# Patient Record
Sex: Male | Born: 1955 | Race: White | Hispanic: No | Marital: Married | Smoking: Never smoker
Health system: Southern US, Community
[De-identification: ages and names within clinical notes are randomized; demographics above are authoritative.]

## PROBLEM LIST (undated history)

## (undated) DIAGNOSIS — I4891 Unspecified atrial fibrillation: Secondary | ICD-10-CM

## (undated) DIAGNOSIS — I10 Essential (primary) hypertension: Secondary | ICD-10-CM

## (undated) DIAGNOSIS — I499 Cardiac arrhythmia, unspecified: Secondary | ICD-10-CM

## (undated) HISTORY — PX: CHOLECYSTECTOMY: SHX55

---

## 2019-02-05 DIAGNOSIS — M8588 Other specified disorders of bone density and structure, other site: Secondary | ICD-10-CM | POA: Diagnosis not present

## 2019-02-05 DIAGNOSIS — M5416 Radiculopathy, lumbar region: Secondary | ICD-10-CM | POA: Diagnosis not present

## 2019-02-11 DIAGNOSIS — M5416 Radiculopathy, lumbar region: Secondary | ICD-10-CM | POA: Diagnosis not present

## 2019-02-26 DIAGNOSIS — M5416 Radiculopathy, lumbar region: Secondary | ICD-10-CM | POA: Diagnosis not present

## 2019-02-26 DIAGNOSIS — M5136 Other intervertebral disc degeneration, lumbar region: Secondary | ICD-10-CM | POA: Diagnosis not present

## 2019-02-27 DIAGNOSIS — M5416 Radiculopathy, lumbar region: Secondary | ICD-10-CM | POA: Diagnosis not present

## 2019-02-27 DIAGNOSIS — M5136 Other intervertebral disc degeneration, lumbar region: Secondary | ICD-10-CM | POA: Diagnosis not present

## 2019-03-05 ENCOUNTER — Other Ambulatory Visit (HOSPITAL_COMMUNITY): Payer: Self-pay | Admitting: Physical Medicine and Rehabilitation

## 2019-03-05 ENCOUNTER — Other Ambulatory Visit: Payer: Self-pay | Admitting: Physical Medicine and Rehabilitation

## 2019-03-05 DIAGNOSIS — M5416 Radiculopathy, lumbar region: Secondary | ICD-10-CM

## 2019-03-09 ENCOUNTER — Other Ambulatory Visit: Payer: Self-pay

## 2019-03-09 ENCOUNTER — Ambulatory Visit
Admission: RE | Admit: 2019-03-09 | Discharge: 2019-03-09 | Disposition: A | Discharge status: Home / Self Care | Payer: 59 | Source: Ambulatory Visit | Attending: Physical Medicine and Rehabilitation | Admitting: Physical Medicine and Rehabilitation

## 2019-03-09 DIAGNOSIS — M545 Low back pain: Secondary | ICD-10-CM | POA: Diagnosis not present

## 2019-03-09 DIAGNOSIS — M5416 Radiculopathy, lumbar region: Secondary | ICD-10-CM | POA: Insufficient documentation

## 2019-03-14 DIAGNOSIS — M5136 Other intervertebral disc degeneration, lumbar region: Secondary | ICD-10-CM | POA: Diagnosis not present

## 2019-03-14 DIAGNOSIS — M48062 Spinal stenosis, lumbar region with neurogenic claudication: Secondary | ICD-10-CM | POA: Diagnosis not present

## 2019-03-14 DIAGNOSIS — M5416 Radiculopathy, lumbar region: Secondary | ICD-10-CM | POA: Diagnosis not present

## 2019-04-01 DIAGNOSIS — M5136 Other intervertebral disc degeneration, lumbar region: Secondary | ICD-10-CM | POA: Diagnosis not present

## 2019-04-01 DIAGNOSIS — M5416 Radiculopathy, lumbar region: Secondary | ICD-10-CM | POA: Diagnosis not present

## 2019-05-17 DIAGNOSIS — M5416 Radiculopathy, lumbar region: Secondary | ICD-10-CM | POA: Diagnosis not present

## 2019-05-17 DIAGNOSIS — M5136 Other intervertebral disc degeneration, lumbar region: Secondary | ICD-10-CM | POA: Diagnosis not present

## 2019-05-17 DIAGNOSIS — M62830 Muscle spasm of back: Secondary | ICD-10-CM | POA: Diagnosis not present

## 2019-05-17 DIAGNOSIS — M48062 Spinal stenosis, lumbar region with neurogenic claudication: Secondary | ICD-10-CM | POA: Diagnosis not present

## 2019-06-13 DIAGNOSIS — M48062 Spinal stenosis, lumbar region with neurogenic claudication: Secondary | ICD-10-CM | POA: Diagnosis not present

## 2019-06-13 DIAGNOSIS — M5416 Radiculopathy, lumbar region: Secondary | ICD-10-CM | POA: Diagnosis not present

## 2019-06-13 DIAGNOSIS — M5136 Other intervertebral disc degeneration, lumbar region: Secondary | ICD-10-CM | POA: Diagnosis not present

## 2020-01-17 ENCOUNTER — Other Ambulatory Visit: Payer: Self-pay

## 2020-01-17 ENCOUNTER — Inpatient Hospital Stay
Admit: 2020-01-17 | Discharge: 2020-01-17 | Disposition: A | Discharge status: Home / Self Care | Payer: Managed Care, Other (non HMO) | Attending: Internal Medicine | Admitting: Internal Medicine

## 2020-01-17 ENCOUNTER — Inpatient Hospital Stay
Admission: EM | Admit: 2020-01-17 | Discharge: 2020-01-22 | DRG: 177 | Disposition: A | Discharge status: Home / Self Care | Payer: Managed Care, Other (non HMO) | Attending: Internal Medicine | Admitting: Internal Medicine

## 2020-01-17 ENCOUNTER — Emergency Department: Payer: Managed Care, Other (non HMO)

## 2020-01-17 ENCOUNTER — Encounter: Payer: Self-pay | Admitting: Emergency Medicine

## 2020-01-17 ENCOUNTER — Inpatient Hospital Stay: Payer: Managed Care, Other (non HMO)

## 2020-01-17 DIAGNOSIS — I10 Essential (primary) hypertension: Secondary | ICD-10-CM | POA: Diagnosis present

## 2020-01-17 DIAGNOSIS — Z888 Allergy status to other drugs, medicaments and biological substances status: Secondary | ICD-10-CM | POA: Diagnosis not present

## 2020-01-17 DIAGNOSIS — J1282 Pneumonia due to coronavirus disease 2019: Secondary | ICD-10-CM | POA: Diagnosis present

## 2020-01-17 DIAGNOSIS — U071 COVID-19: Principal | ICD-10-CM | POA: Diagnosis present

## 2020-01-17 DIAGNOSIS — I081 Rheumatic disorders of both mitral and tricuspid valves: Secondary | ICD-10-CM | POA: Diagnosis present

## 2020-01-17 DIAGNOSIS — J9601 Acute respiratory failure with hypoxia: Secondary | ICD-10-CM | POA: Diagnosis present

## 2020-01-17 DIAGNOSIS — Z79899 Other long term (current) drug therapy: Secondary | ICD-10-CM | POA: Diagnosis not present

## 2020-01-17 DIAGNOSIS — R000 Tachycardia, unspecified: Secondary | ICD-10-CM | POA: Diagnosis present

## 2020-01-17 DIAGNOSIS — Z91040 Latex allergy status: Secondary | ICD-10-CM

## 2020-01-17 DIAGNOSIS — Z9049 Acquired absence of other specified parts of digestive tract: Secondary | ICD-10-CM

## 2020-01-17 DIAGNOSIS — I4891 Unspecified atrial fibrillation: Secondary | ICD-10-CM | POA: Diagnosis present

## 2020-01-17 LAB — CBC
HCT: 45.6 % (ref 39.0–52.0)
Hemoglobin: 15.5 g/dL (ref 13.0–17.0)
MCH: 29.2 pg (ref 26.0–34.0)
MCHC: 34 g/dL (ref 30.0–36.0)
MCV: 86 fL (ref 80.0–100.0)
Platelets: 184 10*3/uL (ref 150–400)
RBC: 5.3 MIL/uL (ref 4.22–5.81)
RDW: 13.2 % (ref 11.5–15.5)
WBC: 9.6 10*3/uL (ref 4.0–10.5)
nRBC: 0 % (ref 0.0–0.2)

## 2020-01-17 LAB — BASIC METABOLIC PANEL
Anion gap: 12 (ref 5–15)
BUN: 27 mg/dL — ABNORMAL HIGH (ref 8–23)
CO2: 25 mmol/L (ref 22–32)
Calcium: 8.7 mg/dL — ABNORMAL LOW (ref 8.9–10.3)
Chloride: 103 mmol/L (ref 98–111)
Creatinine, Ser: 1.07 mg/dL (ref 0.61–1.24)
GFR calc Af Amer: >60 mL/min (ref >60)
GFR calc non Af Amer: >60 mL/min (ref >60)
Glucose, Bld: 112 mg/dL — ABNORMAL HIGH (ref 70–99)
Potassium: 3.8 mmol/L (ref 3.5–5.1)
Sodium: 140 mmol/L (ref 135–145)

## 2020-01-17 LAB — ECHOCARDIOGRAM COMPLETE
Height: 72 in
Weight: 3680 oz

## 2020-01-17 LAB — C-REACTIVE PROTEIN: CRP: 4 mg/dL — ABNORMAL HIGH (ref <1.0)

## 2020-01-17 LAB — HIV ANTIBODY (ROUTINE TESTING W REFLEX): HIV Screen 4th Generation wRfx: NONREACTIVE

## 2020-01-17 LAB — ABO/RH: ABO/RH(D): O NEG

## 2020-01-17 LAB — TRIGLYCERIDES: Triglycerides: 80 mg/dL (ref <150)

## 2020-01-17 LAB — FIBRINOGEN: Fibrinogen: 472 mg/dL (ref 210–475)

## 2020-01-17 LAB — PROCALCITONIN: Procalcitonin: <0.1 ng/mL

## 2020-01-17 LAB — FIBRIN DERIVATIVES D-DIMER (ARMC ONLY): Fibrin derivatives D-dimer (ARMC): 1361.35 ng/mL (FEU) — ABNORMAL HIGH (ref 0.00–499.00)

## 2020-01-17 LAB — FERRITIN: Ferritin: 779 ng/mL — ABNORMAL HIGH (ref 24–336)

## 2020-01-17 LAB — LACTATE DEHYDROGENASE: LDH: 248 U/L — ABNORMAL HIGH (ref 98–192)

## 2020-01-17 LAB — LACTIC ACID, PLASMA: Lactic Acid, Venous: 1.6 mmol/L (ref 0.5–1.9)

## 2020-01-17 MED ORDER — ENOXAPARIN SODIUM 40 MG/0.4ML ~~LOC~~ SOLN
40.0000 mg | SUBCUTANEOUS | Status: DC
  Administered 2020-01-17 – 2020-01-21 (×5): 40 mg via SUBCUTANEOUS
  Filled 2020-01-17 (×5): qty 0.4

## 2020-01-17 MED ORDER — SODIUM CHLORIDE 0.9 % IV SOLN
100.0000 mg | Freq: Every day | INTRAVENOUS | Status: AC
  Administered 2020-01-18 – 2020-01-21 (×4): 100 mg via INTRAVENOUS
  Filled 2020-01-17 (×4): qty 20
  Filled 2020-01-17: qty 100

## 2020-01-17 MED ORDER — DILTIAZEM HCL ER COATED BEADS 120 MG PO CP24
120.0000 mg | ORAL_CAPSULE | Freq: Every day | ORAL | Status: DC
  Administered 2020-01-17 – 2020-01-21 (×5): 120 mg via ORAL
  Filled 2020-01-17 (×8): qty 1

## 2020-01-17 MED ORDER — ALBUTEROL SULFATE (2.5 MG/3ML) 0.083% IN NEBU
5.0000 mg | INHALATION_SOLUTION | Freq: Once | RESPIRATORY_TRACT | Status: DC
  Filled 2020-01-17: qty 6

## 2020-01-17 MED ORDER — DEXAMETHASONE SODIUM PHOSPHATE 10 MG/ML IJ SOLN
6.0000 mg | INTRAMUSCULAR | Status: DC
  Administered 2020-01-18 – 2020-01-22 (×5): 6 mg via INTRAVENOUS
  Filled 2020-01-17 (×5): qty 1

## 2020-01-17 MED ORDER — DEXAMETHASONE SODIUM PHOSPHATE 10 MG/ML IJ SOLN
8.0000 mg | Freq: Once | INTRAMUSCULAR | Status: AC
  Administered 2020-01-17: 8 mg via INTRAVENOUS
  Filled 2020-01-17: qty 1

## 2020-01-17 MED ORDER — IOHEXOL 350 MG/ML SOLN
100.0000 mL | Freq: Once | INTRAVENOUS | Status: AC | PRN
  Administered 2020-01-17: 100 mL via INTRAVENOUS

## 2020-01-17 MED ORDER — ASPIRIN EC 81 MG PO TBEC
81.0000 mg | DELAYED_RELEASE_TABLET | Freq: Every day | ORAL | Status: DC
  Administered 2020-01-17 – 2020-01-21 (×5): 81 mg via ORAL
  Filled 2020-01-17 (×5): qty 1

## 2020-01-17 MED ORDER — GUAIFENESIN-DM 100-10 MG/5ML PO SYRP
10.0000 mL | ORAL_SOLUTION | ORAL | Status: DC | PRN
  Filled 2020-01-17: qty 10

## 2020-01-17 MED ORDER — ONDANSETRON HCL 4 MG/2ML IJ SOLN: 4.0000 mg | Freq: Four times a day (QID) | INTRAMUSCULAR | Status: DC | PRN

## 2020-01-17 MED ORDER — SODIUM CHLORIDE 0.9 % IV SOLN
200.0000 mg | Freq: Once | INTRAVENOUS | Status: AC
  Administered 2020-01-17: 200 mg via INTRAVENOUS
  Filled 2020-01-17: qty 40

## 2020-01-17 MED ORDER — ONDANSETRON HCL 4 MG PO TABS: 4.0000 mg | ORAL_TABLET | Freq: Four times a day (QID) | ORAL | Status: DC | PRN

## 2020-01-17 MED ORDER — ACETAMINOPHEN 325 MG PO TABS
650.0000 mg | ORAL_TABLET | Freq: Four times a day (QID) | ORAL | Status: DC | PRN
  Administered 2020-01-18: 650 mg via ORAL
  Filled 2020-01-17: qty 2

## 2020-01-17 NOTE — Consult Note (Signed)
CARDIOLOGY CONSULT NOTE               Patient ID: Danny Carson MRN: [id number] DOB/AGE: [id number] y.o.  Admit date: 01/17/2020 Referring Physician Dr. Marthenia Rolling hospitalist Primary Physician Dr. Hortencia Pilar primary Primary Cardiologist [facility] Reason for Consultation atrial fibrillation new onset/ COVID-19 positive test (U07.1, COVID-19) with Acute Pneumonia (J12.89, Other viral pneumonia) (If respiratory failure or sepsis present, add as separate assessment)    HPI: 64 year old white male developed Covid pneumonia about 1 week ago his entire family called Covid is sure that symptoms started about a 12 he said simply with 3 to 4 days he has had weakness fever shortness of breath dyspnea with worsening symptoms he finally came to the emergency room he has had fever chills right pleuritic right-sided chest pain generalized weakness no sputum production EKG suggested atrial fibrillation because the patient is hypoxic he was placed on oxygen given steroids and advised to be admitted  Review of systems complete and found to be negative unless listed above     History reviewed. No pertinent past medical history.  Past Surgical History:  Procedure Laterality Date  . CHOLECYSTECTOMY      (Not in a hospital admission)  Social History   Socioeconomic History  . Marital status: Married    Spouse name: Not on file  . Number of children: Not on file  . Years of education: Not on file  . Highest education level: Not on file  Occupational History  . Not on file  Tobacco Use  . Smoking status: Never Smoker  . Smokeless tobacco: Never Used  Substance and Sexual Activity  . Alcohol use: Not on file  . Drug use: Not on file  . Sexual activity: Not on file  Other Topics Concern  . Not on file  Social History Narrative  . Not on file   Social Determinants of Health   Financial Resource Strain:   . Difficulty of Paying Living Expenses:   Food Insecurity:   . Worried  About Charity fundraiser in the Last Year:   . Arboriculturist in the Last Year:   Transportation Needs:   . Film/video editor (Medical):   Marland Kitchen Lack of Transportation (Non-Medical):   Physical Activity:   . Days of Exercise per Week:   . Minutes of Exercise per Session:   Stress:   . Feeling of Stress :   Social Connections:   . Frequency of Communication with Friends and Family:   . Frequency of Social Gatherings with Friends and Family:   . Attends Religious Services:   . Active Member of Clubs or Organizations:   . Attends Archivist Meetings:   Marland Kitchen Marital Status:   Intimate Partner Violence:   . Fear of Current or Ex-Partner:   . Emotionally Abused:   Marland Kitchen Physically Abused:   . Sexually Abused:     No family history on file.    Review of systems complete and found to be negative unless listed above      PHYSICAL EXAM  General: Well developed, well nourished, in no acute distress HEENT:  Normocephalic and atramatic Neck:  No JVD.  Lungs: Clear bilaterally to auscultation and percussion.  Generalized rhonchi diffuse Heart: HRRR . Normal S1 and S2 without gallops or murmurs.  Abdomen: Bowel sounds are positive, abdomen soft and non-tender  Msk:  Back normal, normal gait. Normal strength and tone for age. Extremities: No clubbing, cyanosis or edema.  Neuro: Alert and oriented X 3. Psych:  Good affect, responds appropriately  Labs:   Lab Results  Component Value Date   WBC 9.6 01/17/2020   HGB 15.5 01/17/2020   HCT 45.6 01/17/2020   MCV 86.0 01/17/2020   PLT 184 01/17/2020    Recent Labs  Lab 01/17/20 1103  NA 140  K 3.8  CL 103  CO2 25  BUN 27*  CREATININE 1.07  CALCIUM 8.7*  GLUCOSE 112*   No results found for: CKTOTAL, CKMB, CKMBINDEX, TROPONINI No results found for: CHOL No results found for: HDL No results found for: [facility] Lab Results  Component Value Date   TRIG 80 01/17/2020   No results found for: CHOLHDL No results found  for: LDLDIRECT    Radiology: CT ANGIO CHEST PE W OR WO CONTRAST  Result Date: 01/17/2020 CLINICAL DATA:  64 year old male with 1 week of shortness of breath. Fever. Recently positive for COVID-19. EXAM: CT ANGIOGRAPHY CHEST WITH CONTRAST TECHNIQUE: Multidetector CT imaging of the chest was performed using the standard protocol during bolus administration of intravenous contrast. Multiplanar CT image reconstructions and MIPs were obtained to evaluate the vascular anatomy. CONTRAST:  OMNIPAQUE IOHEXOL 350 MG/ML SOLN COMPARISON:  Portable chest earlier today. FINDINGS: Cardiovascular: Good contrast bolus timing in the pulmonary arterial tree. Mild respiratory motion. No focal filling defect identified in the pulmonary arteries to suggest acute pulmonary embolism. Moderate cardiomegaly. No pericardial effusion. Negative visible aorta. No definite calcified coronary artery atherosclerosis. Mediastinum/Nodes: Mild, reactive appearing mediastinal and bilateral hilar lymph nodes. Lungs/Pleura: Major airways are patent. Widespread bilateral peripheral and peribronchial patchy areas of ground-glass and more solid pulmonary opacity (e.g. Series 6, image 38). All lobes affected. No pleural effusion. Upper Abdomen: Mildly elevated right hemidiaphragm. Surgically absent gallbladder. Probable hepatic steatosis. Negative visible spleen, stomach and large bowel in the upper abdomen. Musculoskeletal: No acute osseous abnormality identified. Osteopenia with occasional chronic appearing mild thoracic compression fractures. Review of the MIP images confirms the above findings. IMPRESSION: 1. Negative for acute pulmonary embolus. 2. Peripheral and scattered patchy areas of ground-glass and solid pulmonary opacity in all lobes compatible with COVID-19 Pneumonia. No pleural effusion. 3. Reactive appearing mediastinal and bilateral hilar lymph nodes. 4. Moderate cardiomegaly. Electronically Signed   By: Odessa Fleming M.D.   On:  01/17/2020 13:30   DG Chest Port 1 View  Result Date: 01/17/2020 CLINICAL DATA:  COVID pneumonia. Additional provided: Shortness of breath for 1 week, diagnosed with COVID 01/13/2020, fever EXAM: PORTABLE CHEST 1 VIEW COMPARISON:  Concurrently performed CT angiogram chest FINDINGS: Heart size within normal limits. Patchy airspace disease throughout both lungs consistent with provided history of COVID pneumonia. There is no evidence of pleural effusion or pneumothorax. No acute bony abnormality. IMPRESSION: Patchy airspace disease throughout both lungs consistent with the provided history of COVID pneumonia. Electronically Signed   By: Jackey Loge DO   On: 01/17/2020 13:15   ECHOCARDIOGRAM COMPLETE  Result Date: 01/17/2020    ECHOCARDIOGRAM REPORT   Patient Name:   Danny Carson Date of Exam: 01/17/2020 Medical Rec #:  [id number]       Height:       72.0 in Accession #:    [id number]      Weight:       230.0 lb Date of Birth:  01/10/1956      BSA:          2.261 m Patient Age:    26 years  BP:           104/59 mmHg Patient Gender: M               HR:           129 bpm. Exam Location:  [facility] Procedure: 2D Echo, Color Doppler and Cardiac Doppler Indications:     Atrial Fibrillation 427.31  History:         Patient has no prior history of Echocardiogram examinations. No                  medical history on file.  Sonographer:     Cristela Blue RDCS (AE) Referring Phys:  [id number] Lucile Shutters Diagnosing Phys: Alwyn Pea MD IMPRESSIONS  1. Left ventricular ejection fraction, by estimation, is 50 to 55%. The left ventricle has low normal function. The left ventricle has no regional wall motion abnormalities. Left ventricular diastolic parameters are consistent with Grade I diastolic dysfunction (impaired relaxation).  2. Right ventricular systolic function is normal. The right ventricular size is normal. There is normal pulmonary artery systolic pressure.  3. The mitral valve is grossly normal. Mild  mitral valve regurgitation.  4. The aortic valve is normal in structure. Aortic valve regurgitation is not visualized. FINDINGS  Left Ventricle: Left ventricular ejection fraction, by estimation, is 50 to 55%. The left ventricle has low normal function. The left ventricle has no regional wall motion abnormalities. The left ventricular internal cavity size was normal in size. There is no left ventricular hypertrophy. Left ventricular diastolic parameters are consistent with Grade I diastolic dysfunction (impaired relaxation). Right Ventricle: The right ventricular size is normal. No increase in right ventricular wall thickness. Right ventricular systolic function is normal. There is normal pulmonary artery systolic pressure. The tricuspid regurgitant velocity is 1.59 m/s, and  with an assumed right atrial pressure of 10 mmHg, the estimated right ventricular systolic pressure is 20.1 mmHg. Left Atrium: Left atrial size was normal in size. Right Atrium: Right atrial size was normal in size. Pericardium: There is no evidence of pericardial effusion. Mitral Valve: The mitral valve is grossly normal. Mild mitral valve regurgitation. Tricuspid Valve: The tricuspid valve is normal in structure. Tricuspid valve regurgitation is mild. Aortic Valve: The aortic valve is normal in structure. Aortic valve regurgitation is not visualized. Aortic valve mean gradient measures 2.0 mmHg. Aortic valve peak gradient measures 4.0 mmHg. Aortic valve area, by VTI measures 4.03 cm. Pulmonic Valve: The pulmonic valve was grossly normal. Pulmonic valve regurgitation is not visualized. Aorta: The aortic root is normal in size and structure. IAS/Shunts: No atrial level shunt detected by color flow Doppler.  LEFT VENTRICLE PLAX 2D LVIDd:         4.00 cm LVIDs:         2.96 cm LV PW:         1.57 cm LV IVS:        1.89 cm LVOT diam:     2.10 cm LV SV:         51 LV SV Index:   22 LVOT Area:     3.46 cm  RIGHT VENTRICLE RV Basal diam:  3.71 cm  RV S prime:     12.80 cm/s TAPSE (M-mode): 3.1 cm LEFT ATRIUM             Index       RIGHT ATRIUM           Index LA diam:  4.10 cm 1.81 cm/m  RA Area:     20.80 cm LA Vol (A2C):   60.8 ml 26.89 ml/m RA Volume:   59.10 ml  26.14 ml/m LA Vol (A4C):   42.2 ml 18.66 ml/m LA Biplane Vol: 51.8 ml 22.91 ml/m  AORTIC VALVE                   PULMONIC VALVE AV Area (Vmax):    2.69 cm    PV Vmax:        0.58 m/s AV Area (Vmean):   2.87 cm    PV Peak grad:   1.4 mmHg AV Area (VTI):     4.03 cm    RVOT Peak grad: 2 mmHg AV Vmax:           100.03 cm/s AV Vmean:          63.700 cm/s AV VTI:            0.125 m AV Peak Grad:      4.0 mmHg AV Mean Grad:      2.0 mmHg LVOT Vmax:         77.60 cm/s LVOT Vmean:        52.800 cm/s LVOT VTI:          0.146 m LVOT/AV VTI ratio: 1.16  AORTA Ao Root diam: 3.00 cm MITRAL VALVE               TRICUSPID VALVE MV Area (PHT): 3.60 cm    TR Peak grad:   10.1 mmHg MV Decel Time: 211 msec    TR Vmax:        159.00 cm/s MV E velocity: 77.60 cm/s                            SHUNTS                            Systemic VTI:  0.15 m                            Systemic Diam: 2.10 cm Rosemary Pentecost D Salena Ortlieb MD Electronically signed by Alwyn Pea MD Signature Date/Time: 01/17/2020/4:38:35 PM    Final     EKG: Atrial fibrillation rapid ventricular response new  ASSESSMENT AND PLAN:  Atrial fibrillation new COVID-19 positive test (U07.1, COVID-19) with Acute Pneumonia (J12.89, Other viral pneumonia) (If respiratory failure or sepsis present, add as separate assessment) Shortness of breath Generalized weakness Tachycardia Hypoxemia . Plan Agree with admit to telemetry because of A. Fib Continue aggressive therapy for Covid submental oxygen steroid Rate control for atrial fibrillation Recommend anticoagulation for A. fib as well as Covid Broad-spectrum antibiotic therapy Supplemental oxygen therapy Agree with echocardiogram Conservative cardiac input   Signed: Alwyn Pea MD 01/17/2020, 6:07 PM

## 2020-01-17 NOTE — ED Notes (Signed)
Patient declined evening meal tray at this time.

## 2020-01-17 NOTE — ED Triage Notes (Signed)
SHOB x1 week , DX with COVID 01/13/20 , fever everyday , no appetite.

## 2020-01-17 NOTE — ED Notes (Signed)
Report given to [facility] in [facility].

## 2020-01-17 NOTE — H&P (Signed)
History and Physical    ODAI Carson CHE:[id number] DOB: 1956/04/24 DOA: 01/17/2020  PCP: Hortencia Pilar, MD   Patient coming from: Home  I have personally briefly reviewed patient's old medical records in [place]  Chief Complaint: Shortness of breath  HPI: Danny Carson is a 64 y.o. male with medical history significant for who was diagnosed with COVID 19 viral infection on January 13, 2020.  He had symptoms 3 to 4 days prior to his diagnosis and multiple family members have infection with Covid 19 virus.  He presents to the ER for evaluation of worsening shortness of breath and is now short of breath at rest.  He has a cough which is productive of clear phlegm. He also complains of  Fever/chills, pleuritic chest pain mostly left-sided as well as poor oral intake.  He denies having any nausea or vomiting, he denies having abdominal pain or any urinary symptoms. Twelve-lead EKG shows rapid atrial fibrillation. Chest x-ray shows patchy airspace disease throughout both lungs consistent with the provided history of COVID pneumonia.   ED Course: Patient with known COVID-19 viral infection who presents to the emergency room for evaluation of worsening shortness of breath.  On 2 L of oxygen he had a pulse oximetry of 91%  Review of Systems: As per HPI otherwise 10 point review of systems negative.    History reviewed. No pertinent past medical history.  Past Surgical History:  Procedure Laterality Date  . CHOLECYSTECTOMY       reports that he has never smoked. He has never used smokeless tobacco. No history on file for alcohol and drug.  Allergies  Allergen Reactions  . Latex     No family history on file.   Prior to Admission medications   Not on File    Physical Exam: Vitals:   01/17/20 1057 01/17/20 1058  BP: (!) 104/59   Pulse: (!) 129   Resp: 20   Temp: 100 F (37.8 C)   TempSrc: Oral   SpO2: 91%   Weight:  104.3 kg  Height:  6' (1.829 m)      Vitals:   01/17/20 1057 01/17/20 1058  BP: (!) 104/59   Pulse: (!) 129   Resp: 20   Temp: 100 F (37.8 C)   TempSrc: Oral   SpO2: 91%   Weight:  104.3 kg  Height:  6' (1.829 m)    Constitutional: NAD, alert and oriented x 3 Eyes: PERRL, lids and conjunctivae normal ENMT: Mucous membranes are moist.  Neck: normal, supple, no masses, no thyromegaly Respiratory: Bilateral air entry, no wheezing, faint crackles at the bases. Normal respiratory effort. No accessory muscle use.  Cardiovascular: Irregularly irregular, tachycardic no murmurs / rubs / gallops. No extremity edema. 2+ pedal pulses. No carotid bruits.  Abdomen: no tenderness, no masses palpated. No hepatosplenomegaly. Bowel sounds positive.  Musculoskeletal: no clubbing / cyanosis. No joint deformity upper and lower extremities.  Skin: no rashes, lesions, ulcers.  Neurologic: No gross focal neurologic deficit. Psychiatric: Normal mood and affect.   Labs on Admission: I have personally reviewed following labs and imaging studies  CBC: Recent Labs  Lab 01/17/20 1103  WBC 9.6  HGB 15.5  HCT 45.6  MCV 86.0  PLT 536   Basic Metabolic Panel: Recent Labs  Lab 01/17/20 1103  NA 140  K 3.8  CL 103  CO2 25  GLUCOSE 112*  BUN 27*  CREATININE 1.07  CALCIUM 8.7*   GFR: Estimated Creatinine Clearance:  88.3 mL/min (by C-G formula based on SCr of 1.07 mg/dL). Liver Function Tests: No results for input(s): AST, ALT, ALKPHOS, BILITOT, PROT, ALBUMIN in the last 168 hours. No results for input(s): LIPASE, AMYLASE in the last 168 hours. No results for input(s): AMMONIA in the last 168 hours. Coagulation Profile: No results for input(s): INR, PROTIME in the last 168 hours. Cardiac Enzymes: No results for input(s): CKTOTAL, CKMB, CKMBINDEX, TROPONINI in the last 168 hours. BNP (last 3 results) No results for input(s): PROBNP in the last 8760 hours. HbA1C: No results for input(s): HGBA1C in the last 72  hours. CBG: No results for input(s): GLUCAP in the last 168 hours. Lipid Profile: No results for input(s): CHOL, HDL, LDLCALC, TRIG, CHOLHDL, LDLDIRECT in the last 72 hours. Thyroid Function Tests: No results for input(s): TSH, T4TOTAL, FREET4, T3FREE, THYROIDAB in the last 72 hours. Anemia Panel: No results for input(s): VITAMINB12, FOLATE, FERRITIN, TIBC, IRON, RETICCTPCT in the last 72 hours. Urine analysis: No results found for: COLORURINE, APPEARANCEUR, LABSPEC, PHURINE, GLUCOSEU, HGBUR, BILIRUBINUR, KETONESUR, PROTEINUR, UROBILINOGEN, NITRITE, LEUKOCYTESUR  Radiological Exams on Admission: No results found.  EKG: Independently reviewed.  Atrial fibrillation  Assessment/Plan Active Problems:   Pneumonia due to COVID-19 virus   Atrial fibrillation with RVR (HCC)    COVID-19 pneumonia We will start patient on remdesivir per protocol Start patient on Decadron 6 mg IV daily Patient is on 2 L of oxygen with pulse oximetry of about 90 to 91% Continue oxygen supplementation to maintain pulse oximetry greater than 92% Antitussives   Atrial fibrillation rapid ventricular rate Appears to be new onset No prior EKGs to compare with We will start patient on Cardizem for rate control Obtain TSH Consult cardiology Patient has a CHADS2VASC score of 0 and ideally does not require long-term anticoagulation as primary prophylaxis for an acute stroke Start patient on aspirin 81 mg daily Obtain CT angiogram to rule out acute PE  DVT prophylaxis: Lovenox Code Status: Full Code Family Communication: Plan of care was discussed with patient at the bedside.  He verbalizes understanding and agrees with the plan Disposition Plan: Back to previous home environment Consults called: None     Mariyanna Mucha MD [facility]     01/17/2020, 12:28 PM

## 2020-01-17 NOTE — ED Provider Notes (Signed)
[facility] Emergency Department Provider Note   ____________________________________________    I have reviewed the triage vital signs and the nursing notes.   HISTORY  Chief Complaint Covid Exposure     HPI Danny Carson is a 64 y.o. male presents with complaints of shortness of breath, fatigue, fevers, weakness.  Patient reports he was diagnosed with COVID-19 5 days ago.  He reports worsening shortness of breath over the last several days.  He notes symptoms started approximately 4 days prior to diagnosis.  Recently traveled to [place].  Was prescribed prednisone and cough medication by his PCP.  No nausea or vomiting.  History reviewed. No pertinent past medical history.  Patient Active Problem List   Diagnosis Date Noted  . Pneumonia due to COVID-19 virus 01/17/2020  . Atrial fibrillation with RVR ([place]) 01/17/2020    Past Surgical History:  Procedure Laterality Date  . CHOLECYSTECTOMY      Prior to Admission medications   Medication Sig Start Date End Date Taking? Authorizing Provider  hydrochlorothiazide (MICROZIDE) 12.5 MG capsule Take 12.5 mg by mouth daily. 10/30/19  Yes [provider]  losartan (COZAAR) 100 MG tablet Take 100 mg by mouth daily. 01/06/20  Yes [provider]     Allergies Latex and Ace inhibitors  No family history on file.  Social History Social History   Tobacco Use  . Smoking status: Never Smoker  . Smokeless tobacco: Never Used  Substance Use Topics  . Alcohol use: Not on file  . Drug use: Not on file    Review of Systems  Constitutional: As above Eyes: No visual changes.  ENT: No sore throat. Cardiovascular: Denies chest pain. Respiratory: As above Gastrointestinal: No abdominal pain.    Genitourinary: Negative for dysuria. Musculoskeletal: Body aches Skin: Negative for rash. Neurological: Negative for  headaches   ____________________________________________   PHYSICAL EXAM:  VITAL SIGNS: ED Triage Vitals  Enc Vitals Group     BP 01/17/20 1057 (!) 104/59     Pulse Rate 01/17/20 1057 (!) 129     Resp 01/17/20 1057 20     Temp 01/17/20 1057 100 F (37.8 C)     Temp Source 01/17/20 1057 Oral     SpO2 01/17/20 1057 91 %     Weight 01/17/20 1058 104.3 kg (230 lb)     Height 01/17/20 1058 1.829 m (6')     Head Circumference --      Peak Flow --      Pain Score 01/17/20 1058 4     Pain Loc --      Pain Edu? --      Excl. in [place]? --     Constitutional: Alert and oriented.   Nose: No congestion/rhinnorhea. Mouth/Throat: Mucous membranes are moist.    Cardiovascular: Normal rate, regular rhythm.  Good peripheral circulation. Respiratory: Normal respiratory effort.  No retractions.  Gastrointestinal: Soft and nontender. No distention.  Musculoskeletal: No lower extremity tenderness nor edema.  Warm and well perfused Neurologic:  Normal speech and language. No gross focal neurologic deficits are appreciated.  Skin:  Skin is warm, dry and intact. No rash noted. Psychiatric: Mood and affect are normal. Speech and behavior are normal.  ____________________________________________   LABS (all labs ordered are listed, but only abnormal results are displayed)  Labs Reviewed  BASIC METABOLIC PANEL - Abnormal; Notable for the following components:      Result Value   Glucose, Bld 112 (*)  BUN 27 (*)    Calcium 8.7 (*)    All other components within normal limits  FIBRIN DERIVATIVES D-DIMER ([facility] ONLY) - Abnormal; Notable for the following components:   Fibrin derivatives D-dimer ([facility]) 1,361.35 (*)    All other components within normal limits  LACTATE DEHYDROGENASE - Abnormal; Notable for the following components:   LDH 248 (*)    All other components within normal limits  FERRITIN - Abnormal; Notable for the following components:   Ferritin 779 (*)    All other  components within normal limits  CULTURE, BLOOD (ROUTINE X 2)  CULTURE, BLOOD (ROUTINE X 2)  CBC  LACTIC ACID, PLASMA  TRIGLYCERIDES  FIBRINOGEN  LACTIC ACID, PLASMA  PROCALCITONIN  C-REACTIVE PROTEIN  HIV ANTIBODY (ROUTINE TESTING W REFLEX)  ABO/RH   ____________________________________________  EKG  ED ECG REPORT I, Jene Every, the attending physician, personally viewed and interpreted this ECG.  Date: 01/17/2020  Rhythm: Atrial fibrillation QRS Axis: normal Intervals: Abnormal ST/T Wave abnormalities: normal Narrative Interpretation: no evidence of acute ischemia  ____________________________________________  RADIOLOGY  EKG viewed by me, patchy infiltrates consistent with COVID-19 pneumonia ____________________________________________   PROCEDURES  Procedure(s) performed: No  Procedures   Critical Care performed: yes  CRITICAL CARE Performed by: Jene Every   Total critical care time: 30 minutes  Critical care time was exclusive of separately billable procedures and treating other patients.  Critical care was necessary to treat or prevent imminent or life-threatening deterioration.  Critical care was time spent personally by me on the following activities: development of treatment plan with patient and/or surrogate as well as nursing, discussions with consultants, evaluation of patient's response to treatment, examination of patient, obtaining history from patient or surrogate, ordering and performing treatments and interventions, ordering and review of laboratory studies, ordering and review of radiographic studies, pulse oximetry and re-evaluation of patient's condition.  ____________________________________________   INITIAL IMPRESSION / ASSESSMENT AND PLAN / ED COURSE  Pertinent labs & imaging results that were available during my care of the patient were reviewed by me and considered in my medical decision making (see chart for  details).  Patient presents with known COVID-19 infection.  Worsening shortness of breath, requiring 2 L nasal cannula to keep his saturations above 90%.  Significant tachycardia and elevated temperature consistent with novel coronavirus.  Chest x-ray confirms COVID-19 pneumonia.  Treated with IV Decadron.  Lab work is overall reassuring, will discuss with the hospitalist for admission    ____________________________________________   FINAL CLINICAL IMPRESSION(S) / ED DIAGNOSES  Final diagnoses:  Acute hypoxemic respiratory failure due to COVID-19 [facility])        Note:  This document was prepared using Dragon voice recognition software and may include unintentional dictation errors.   Jene Every, MD 01/17/20 [phone]

## 2020-01-17 NOTE — Progress Notes (Signed)
*  PRELIMINARY RESULTS* Echocardiogram 2D Echocardiogram has been performed.  Cristela Blue 01/17/2020, 2:26 PM

## 2020-01-18 LAB — CBC WITH DIFFERENTIAL/PLATELET
Abs Immature Granulocytes: 0.05 10*3/uL (ref 0.00–0.07)
Basophils Absolute: 0 10*3/uL (ref 0.0–0.1)
Basophils Relative: 0 %
Eosinophils Absolute: 0 10*3/uL (ref 0.0–0.5)
Eosinophils Relative: 0 %
HCT: 42.5 % (ref 39.0–52.0)
Hemoglobin: 14.8 g/dL (ref 13.0–17.0)
Immature Granulocytes: 1 %
Lymphocytes Relative: 11 %
Lymphs Abs: 1.1 10*3/uL (ref 0.7–4.0)
MCH: 30.2 pg (ref 26.0–34.0)
MCHC: 34.8 g/dL (ref 30.0–36.0)
MCV: 86.7 fL (ref 80.0–100.0)
Monocytes Absolute: 0.7 10*3/uL (ref 0.1–1.0)
Monocytes Relative: 7 %
Neutro Abs: 8.1 10*3/uL — ABNORMAL HIGH (ref 1.7–7.7)
Neutrophils Relative %: 81 %
Platelets: 196 10*3/uL (ref 150–400)
RBC: 4.9 MIL/uL (ref 4.22–5.81)
RDW: 13.2 % (ref 11.5–15.5)
Smear Review: NORMAL
WBC: 10.1 10*3/uL (ref 4.0–10.5)
nRBC: 0 % (ref 0.0–0.2)

## 2020-01-18 LAB — COMPREHENSIVE METABOLIC PANEL
ALT: 30 U/L (ref 0–44)
AST: 28 U/L (ref 15–41)
Albumin: 3.1 g/dL — ABNORMAL LOW (ref 3.5–5.0)
Alkaline Phosphatase: 40 U/L (ref 38–126)
Anion gap: 9 (ref 5–15)
BUN: 25 mg/dL — ABNORMAL HIGH (ref 8–23)
CO2: 28 mmol/L (ref 22–32)
Calcium: 8.4 mg/dL — ABNORMAL LOW (ref 8.9–10.3)
Chloride: 103 mmol/L (ref 98–111)
Creatinine, Ser: 0.83 mg/dL (ref 0.61–1.24)
GFR calc Af Amer: >60 mL/min (ref >60)
GFR calc non Af Amer: >60 mL/min (ref >60)
Glucose, Bld: 118 mg/dL — ABNORMAL HIGH (ref 70–99)
Potassium: 3.8 mmol/L (ref 3.5–5.1)
Sodium: 140 mmol/L (ref 135–145)
Total Bilirubin: 1 mg/dL (ref 0.3–1.2)
Total Protein: 6.6 g/dL (ref 6.5–8.1)

## 2020-01-18 LAB — MAGNESIUM: Magnesium: 1.9 mg/dL (ref 1.7–2.4)

## 2020-01-18 LAB — C-REACTIVE PROTEIN: CRP: 5.3 mg/dL — ABNORMAL HIGH (ref <1.0)

## 2020-01-18 LAB — TSH: TSH: 0.255 u[IU]/mL — ABNORMAL LOW (ref 0.350–4.500)

## 2020-01-18 LAB — PHOSPHORUS: Phosphorus: 3.1 mg/dL (ref 2.5–4.6)

## 2020-01-18 LAB — FIBRIN DERIVATIVES D-DIMER (ARMC ONLY): Fibrin derivatives D-dimer (ARMC): 1020.69 ng/mL (FEU) — ABNORMAL HIGH (ref 0.00–499.00)

## 2020-01-18 LAB — FERRITIN: Ferritin: 973 ng/mL — ABNORMAL HIGH (ref 24–336)

## 2020-01-18 NOTE — Progress Notes (Signed)
1         at [facility]   PATIENT NAME: Danny Carson    MR#:  [id number]  DATE OF BIRTH:  1955/10/19  SUBJECTIVE:  CHIEF COMPLAINT:   Chief Complaint  Patient presents with  . Covid Exposure  sob +, afebrile now REVIEW OF SYSTEMS:  Review of Systems  Constitutional: Negative for diaphoresis, fever, malaise/fatigue and weight loss.  HENT: Negative for ear discharge, ear pain, hearing loss, nosebleeds, sore throat and tinnitus.   Eyes: Negative for blurred vision and pain.  Respiratory: Positive for shortness of breath. Negative for cough, hemoptysis and wheezing.   Cardiovascular: Negative for chest pain, palpitations, orthopnea and leg swelling.  Gastrointestinal: Negative for abdominal pain, blood in stool, constipation, diarrhea, heartburn, nausea and vomiting.  Genitourinary: Negative for dysuria, frequency and urgency.  Musculoskeletal: Negative for back pain and myalgias.  Skin: Negative for itching and rash.  Neurological: Negative for dizziness, tingling, tremors, focal weakness, seizures, weakness and headaches.  Psychiatric/Behavioral: Negative for depression. The patient is not nervous/anxious.    DRUG ALLERGIES:   Allergies  Allergen Reactions  . Latex Swelling  . Ace Inhibitors Cough   VITALS:  Blood pressure 118/83, pulse 91, temperature 98.3 F (36.8 C), temperature source Oral, resp. rate (!) 22, height 6' (1.829 m), weight 104.3 kg, SpO2 92 %. PHYSICAL EXAMINATION:  Physical Exam HENT:     Head: Normocephalic and atraumatic.  Eyes:     Conjunctiva/sclera: Conjunctivae normal.     Pupils: Pupils are equal, round, and reactive to light.  Neck:     Thyroid: No thyromegaly.     Trachea: No tracheal deviation.  Cardiovascular:     Rate and Rhythm: Normal rate and regular rhythm.     Heart sounds: Normal heart sounds.  Pulmonary:     Effort: Pulmonary effort is normal. No respiratory distress.     Breath sounds: Normal breath  sounds. No wheezing.  Chest:     Chest wall: No tenderness.  Abdominal:     General: Bowel sounds are normal. There is no distension.     Palpations: Abdomen is soft.     Tenderness: There is no abdominal tenderness.  Musculoskeletal:        General: Normal range of motion.     Cervical back: Normal range of motion and neck supple.  Skin:    General: Skin is warm and dry.     Findings: No rash.  Neurological:     Mental Status: He is alert and oriented to person, place, and time.     Cranial Nerves: No cranial nerve deficit.    LABORATORY PANEL:  Male CBC Recent Labs  Lab 01/18/20 0623  WBC 10.1  HGB 14.8  HCT 42.5  PLT 196   ------------------------------------------------------------------------------------------------------------------ Chemistries  Recent Labs  Lab 01/18/20 0623  NA 140  K 3.8  CL 103  CO2 28  GLUCOSE 118*  BUN 25*  CREATININE 0.83  CALCIUM 8.4*  MG 1.9  AST 28  ALT 30  ALKPHOS 40  BILITOT 1.0   RADIOLOGY:  ECHOCARDIOGRAM COMPLETE  Result Date: 01/17/2020    ECHOCARDIOGRAM REPORT   Patient Name:   Danny Carson Date of Exam: 01/17/2020 Medical Rec #:  [id number]       Height:       72.0 in Accession #:    [id number]      Weight:       230.0 lb Date of Birth:  03/14/1956  BSA:          2.261 m Patient Age:    17 years        BP:           104/59 mmHg Patient Gender: M               HR:           129 bpm. Exam Location:  [facility] Procedure: 2D Echo, Color Doppler and Cardiac Doppler Indications:     Atrial Fibrillation 427.31  History:         Patient has no prior history of Echocardiogram examinations. No                  medical history on file.  Sonographer:     Sherrie Sport RDCS (AE) Referring Phys:  [id number] Collier Bullock Diagnosing Phys: Yolonda Kida MD IMPRESSIONS  1. Left ventricular ejection fraction, by estimation, is 50 to 55%. The left ventricle has low normal function. The left ventricle has no regional wall motion abnormalities.  Left ventricular diastolic parameters are consistent with Grade I diastolic dysfunction (impaired relaxation).  2. Right ventricular systolic function is normal. The right ventricular size is normal. There is normal pulmonary artery systolic pressure.  3. The mitral valve is grossly normal. Mild mitral valve regurgitation.  4. The aortic valve is normal in structure. Aortic valve regurgitation is not visualized. FINDINGS  Left Ventricle: Left ventricular ejection fraction, by estimation, is 50 to 55%. The left ventricle has low normal function. The left ventricle has no regional wall motion abnormalities. The left ventricular internal cavity size was normal in size. There is no left ventricular hypertrophy. Left ventricular diastolic parameters are consistent with Grade I diastolic dysfunction (impaired relaxation). Right Ventricle: The right ventricular size is normal. No increase in right ventricular wall thickness. Right ventricular systolic function is normal. There is normal pulmonary artery systolic pressure. The tricuspid regurgitant velocity is 1.59 m/s, and  with an assumed right atrial pressure of 10 mmHg, the estimated right ventricular systolic pressure is 46.9 mmHg. Left Atrium: Left atrial size was normal in size. Right Atrium: Right atrial size was normal in size. Pericardium: There is no evidence of pericardial effusion. Mitral Valve: The mitral valve is grossly normal. Mild mitral valve regurgitation. Tricuspid Valve: The tricuspid valve is normal in structure. Tricuspid valve regurgitation is mild. Aortic Valve: The aortic valve is normal in structure. Aortic valve regurgitation is not visualized. Aortic valve mean gradient measures 2.0 mmHg. Aortic valve peak gradient measures 4.0 mmHg. Aortic valve area, by VTI measures 4.03 cm. Pulmonic Valve: The pulmonic valve was grossly normal. Pulmonic valve regurgitation is not visualized. Aorta: The aortic root is normal in size and structure.  IAS/Shunts: No atrial level shunt detected by color flow Doppler.  LEFT VENTRICLE PLAX 2D LVIDd:         4.00 cm LVIDs:         2.96 cm LV PW:         1.57 cm LV IVS:        1.89 cm LVOT diam:     2.10 cm LV SV:         51 LV SV Index:   22 LVOT Area:     3.46 cm  RIGHT VENTRICLE RV Basal diam:  3.71 cm RV S prime:     12.80 cm/s TAPSE (M-mode): 3.1 cm LEFT ATRIUM             Index  RIGHT ATRIUM           Index LA diam:        4.10 cm 1.81 cm/m  RA Area:     20.80 cm LA Vol (A2C):   60.8 ml 26.89 ml/m RA Volume:   59.10 ml  26.14 ml/m LA Vol (A4C):   42.2 ml 18.66 ml/m LA Biplane Vol: 51.8 ml 22.91 ml/m  AORTIC VALVE                   PULMONIC VALVE AV Area (Vmax):    2.69 cm    PV Vmax:        0.58 m/s AV Area (Vmean):   2.87 cm    PV Peak grad:   1.4 mmHg AV Area (VTI):     4.03 cm    RVOT Peak grad: 2 mmHg AV Vmax:           100.03 cm/s AV Vmean:          63.700 cm/s AV VTI:            0.125 m AV Peak Grad:      4.0 mmHg AV Mean Grad:      2.0 mmHg LVOT Vmax:         77.60 cm/s LVOT Vmean:        52.800 cm/s LVOT VTI:          0.146 m LVOT/AV VTI ratio: 1.16  AORTA Ao Root diam: 3.00 cm MITRAL VALVE               TRICUSPID VALVE MV Area (PHT): 3.60 cm    TR Peak grad:   10.1 mmHg MV Decel Time: 211 msec    TR Vmax:        159.00 cm/s MV E velocity: 77.60 cm/s                            SHUNTS                            Systemic VTI:  0.15 m                            Systemic Diam: 2.10 cm Dwayne D Callwood MD Electronically signed by Alwyn Pea MD Signature Date/Time: 01/17/2020/4:38:35 PM    Final    ASSESSMENT AND PLAN:   COVID-19 pneumonia - remdesivir day 2/5 - Decadron 6 mg IV daily - on 2 L of oxygen with pulse oximetry of about 90 to 91% Continue oxygen supplementation to maintain pulse oximetry greater than 92% Antitussives  Atrial fibrillation rapid ventricular rate - Appears to be new onset - Cardizem 120 mg for rate control Obtain TSH Cardiology input  appreciated Patient has a CHADS2VASC score of 0 and ideally does not require long-term anticoagulation as primary prophylaxis for an acute stroke - aspirin 81 mg daily -CT angiogram neg for acute PE   Status is: Inpatient  Remains inpatient appropriate because:Inpatient level of care appropriate due to severity of illness   Dispo: The patient is from: Home              Anticipated d/c is to: Home              Anticipated d/c date is: > 3 days  Patient currently is not medically stable to d/c.     DVT prophylaxis: Lovenox Family Communication: discussed with patient   All the records are reviewed and case discussed with Care Management/Social Worker. Management plans discussed with the patient, nursing and they are in agreement.  CODE STATUS: Full Code  TOTAL TIME TAKING CARE OF THIS PATIENT: 35 minutes.   More than 50% of the time was spent in counseling/coordination of care: YES  POSSIBLE D/C IN 3 DAYS, DEPENDING ON CLINICAL CONDITION.   Delfino Lovett M.D on 01/18/2020 at 1:26 PM  [facility]   CC: Primary care physician; Rolm Gala, MD  Note: This dictation was prepared with Dragon dictation along with smaller phrase technology. Any transcriptional errors that result from this process are unintentional.

## 2020-01-18 NOTE — Progress Notes (Signed)
[facility] Cardiology    SUBJECTIVE: Still weak and fatigued . Palpiations sob sick feeling. Denies trauma.   Vitals:   01/18/20 0411 01/18/20 0853 01/18/20 1100 01/18/20 1501  BP:  118/83  126/66  Pulse:  91 99 81  Resp:  (!) 22 20   Temp: 100 F (37.8 C) 98.3 F (36.8 C)    TempSrc:  Oral    SpO2:  92% 97% 92%  Weight:      Height:         Intake/Output Summary (Last 24 hours) at 01/18/2020 2310 Last data filed at 01/18/2020 1509 Gross per 24 hour  Intake 0 ml  Output --  Net 0 ml      PHYSICAL EXAM  General: Well developed, well nourished, in no acute distress HEENT:  Normocephalic and atramatic Neck:  No JVD.  Lungs: Clear bilaterally to auscultation and percussion. Heart: HRRR . Normal S1 and S2 without gallops or murmurs.  Abdomen: Bowel sounds are positive, abdomen soft and non-tender  Msk:  Back normal, normal gait. Normal strength and tone for age. Extremities: No clubbing, cyanosis or edema.   Neuro: Alert and oriented X 3. Psych:  Good affect, responds appropriately   LABS: Basic Metabolic Panel: Recent Labs    01/17/20 1103 01/18/20 0623  NA 140 140  K 3.8 3.8  CL 103 103  CO2 25 28  GLUCOSE 112* 118*  BUN 27* 25*  CREATININE 1.07 0.83  CALCIUM 8.7* 8.4*  MG  --  1.9  PHOS  --  3.1   Liver Function Tests: Recent Labs    01/18/20 0623  AST 28  ALT 30  ALKPHOS 40  BILITOT 1.0  PROT 6.6  ALBUMIN 3.1*   No results for input(s): LIPASE, AMYLASE in the last 72 hours. CBC: Recent Labs    01/17/20 1103 01/18/20 0623  WBC 9.6 10.1  NEUTROABS  --  8.1*  HGB 15.5 14.8  HCT 45.6 42.5  MCV 86.0 86.7  PLT 184 196   Cardiac Enzymes: No results for input(s): CKTOTAL, CKMB, CKMBINDEX, TROPONINI in the last 72 hours. BNP: Invalid input(s): POCBNP D-Dimer: No results for input(s): DDIMER in the last 72 hours. Hemoglobin A1C: No results for input(s): HGBA1C in the last 72 hours. Fasting Lipid Panel: Recent Labs    01/17/20 1156  TRIG 80    Thyroid Function Tests: Recent Labs    01/18/20 0623  TSH 0.255*   Anemia Panel: Recent Labs    01/18/20 0623  FERRITIN 973*    CT ANGIO CHEST PE W OR WO CONTRAST  Result Date: 01/17/2020 CLINICAL DATA:  64 year old male with 1 week of shortness of breath. Fever. Recently positive for COVID-19. EXAM: CT ANGIOGRAPHY CHEST WITH CONTRAST TECHNIQUE: Multidetector CT imaging of the chest was performed using the standard protocol during bolus administration of intravenous contrast. Multiplanar CT image reconstructions and MIPs were obtained to evaluate the vascular anatomy. CONTRAST:  OMNIPAQUE IOHEXOL 350 MG/ML SOLN COMPARISON:  Portable chest earlier today. FINDINGS: Cardiovascular: Good contrast bolus timing in the pulmonary arterial tree. Mild respiratory motion. No focal filling defect identified in the pulmonary arteries to suggest acute pulmonary embolism. Moderate cardiomegaly. No pericardial effusion. Negative visible aorta. No definite calcified coronary artery atherosclerosis. Mediastinum/Nodes: Mild, reactive appearing mediastinal and bilateral hilar lymph nodes. Lungs/Pleura: Major airways are patent. Widespread bilateral peripheral and peribronchial patchy areas of ground-glass and more solid pulmonary opacity (e.g. Series 6, image 38). All lobes affected. No pleural effusion. Upper Abdomen: Mildly  elevated right hemidiaphragm. Surgically absent gallbladder. Probable hepatic steatosis. Negative visible spleen, stomach and large bowel in the upper abdomen. Musculoskeletal: No acute osseous abnormality identified. Osteopenia with occasional chronic appearing mild thoracic compression fractures. Review of the MIP images confirms the above findings. IMPRESSION: 1. Negative for acute pulmonary embolus. 2. Peripheral and scattered patchy areas of ground-glass and solid pulmonary opacity in all lobes compatible with COVID-19 Pneumonia. No pleural effusion. 3. Reactive appearing mediastinal  and bilateral hilar lymph nodes. 4. Moderate cardiomegaly. Electronically Signed   By: Danny Carson M.D.   On: 01/17/2020 13:30   DG Chest Port 1 View  Result Date: 01/17/2020 CLINICAL DATA:  COVID pneumonia. Additional provided: Shortness of breath for 1 week, diagnosed with COVID 01/13/2020, fever EXAM: PORTABLE CHEST 1 VIEW COMPARISON:  Concurrently performed CT angiogram chest FINDINGS: Heart size within normal limits. Patchy airspace disease throughout both lungs consistent with provided history of COVID pneumonia. There is no evidence of pleural effusion or pneumothorax. No acute bony abnormality. IMPRESSION: Patchy airspace disease throughout both lungs consistent with the provided history of COVID pneumonia. Electronically Signed   By: Danny Loge DO   On: 01/17/2020 13:15   ECHOCARDIOGRAM COMPLETE  Result Date: 01/17/2020    ECHOCARDIOGRAM REPORT   Patient Name:   Danny Carson Date of Exam: 01/17/2020 Medical Rec #:  [id number]       Height:       72.0 in Accession #:    [id number]      Weight:       230.0 lb Date of Birth:  [birth year]      BSA:          2.261 m Patient Age:    63 years        BP:           104/59 mmHg Patient Gender: M               HR:           129 bpm. Exam Location:  [facility] Procedure: 2D Echo, Color Doppler and Cardiac Doppler Indications:     Atrial Fibrillation 427.31  History:         Patient has no prior history of Echocardiogram examinations. No                  medical history on file.  Sonographer:     Danny Carson RDCS (AE) Referring Phys:  [id number] Lucile Shutters Diagnosing Phys: Danny Pea MD IMPRESSIONS  1. Left ventricular ejection fraction, by estimation, is 50 to 55%. The left ventricle has low normal function. The left ventricle has no regional wall motion abnormalities. Left ventricular diastolic parameters are consistent with Grade I diastolic dysfunction (impaired relaxation).  2. Right ventricular systolic function is normal. The right ventricular size is  normal. There is normal pulmonary artery systolic pressure.  3. The mitral valve is grossly normal. Mild mitral valve regurgitation.  4. The aortic valve is normal in structure. Aortic valve regurgitation is not visualized. FINDINGS  Left Ventricle: Left ventricular ejection fraction, by estimation, is 50 to 55%. The left ventricle has low normal function. The left ventricle has no regional wall motion abnormalities. The left ventricular internal cavity size was normal in size. There is no left ventricular hypertrophy. Left ventricular diastolic parameters are consistent with Grade I diastolic dysfunction (impaired relaxation). Right Ventricle: The right ventricular size is normal. No increase in right ventricular wall thickness. Right ventricular systolic function is normal.  There is normal pulmonary artery systolic pressure. The tricuspid regurgitant velocity is 1.59 m/s, and  with an assumed right atrial pressure of 10 mmHg, the estimated right ventricular systolic pressure is 20.1 mmHg. Left Atrium: Left atrial size was normal in size. Right Atrium: Right atrial size was normal in size. Pericardium: There is no evidence of pericardial effusion. Mitral Valve: The mitral valve is grossly normal. Mild mitral valve regurgitation. Tricuspid Valve: The tricuspid valve is normal in structure. Tricuspid valve regurgitation is mild. Aortic Valve: The aortic valve is normal in structure. Aortic valve regurgitation is not visualized. Aortic valve mean gradient measures 2.0 mmHg. Aortic valve peak gradient measures 4.0 mmHg. Aortic valve area, by VTI measures 4.03 cm. Pulmonic Valve: The pulmonic valve was grossly normal. Pulmonic valve regurgitation is not visualized. Aorta: The aortic root is normal in size and structure. IAS/Shunts: No atrial level shunt detected by color flow Doppler.  LEFT VENTRICLE PLAX 2D LVIDd:         4.00 cm LVIDs:         2.96 cm LV PW:         1.57 cm LV IVS:        1.89 cm LVOT diam:     2.10  cm LV SV:         51 LV SV Index:   22 LVOT Area:     3.46 cm  RIGHT VENTRICLE RV Basal diam:  3.71 cm RV S prime:     12.80 cm/s TAPSE (M-mode): 3.1 cm LEFT ATRIUM             Index       RIGHT ATRIUM           Index LA diam:        4.10 cm 1.81 cm/m  RA Area:     20.80 cm LA Vol (A2C):   60.8 ml 26.89 ml/m RA Volume:   59.10 ml  26.14 ml/m LA Vol (A4C):   42.2 ml 18.66 ml/m LA Biplane Vol: 51.8 ml 22.91 ml/m  AORTIC VALVE                   PULMONIC VALVE AV Area (Vmax):    2.69 cm    PV Vmax:        0.58 m/s AV Area (Vmean):   2.87 cm    PV Peak grad:   1.4 mmHg AV Area (VTI):     4.03 cm    RVOT Peak grad: 2 mmHg AV Vmax:           100.03 cm/s AV Vmean:          63.700 cm/s AV VTI:            0.125 m AV Peak Grad:      4.0 mmHg AV Mean Grad:      2.0 mmHg LVOT Vmax:         77.60 cm/s LVOT Vmean:        52.800 cm/s LVOT VTI:          0.146 m LVOT/AV VTI ratio: 1.16  AORTA Ao Root diam: 3.00 cm MITRAL VALVE               TRICUSPID VALVE MV Area (PHT): 3.60 cm    TR Peak grad:   10.1 mmHg MV Decel Time: 211 msec    TR Vmax:        159.00 cm/s MV E velocity: 77.60 cm/s  SHUNTS                            Systemic VTI:  0.15 m                            Systemic Diam: 2.10 cm Mianna Iezzi D Jazaria Jarecki MD Electronically signed by Yolonda Kida MD Signature Date/Time: 01/17/2020/4:38:35 PM    Final      Echo normal LVF  TELEMETRY: AFIB 100-110 nsstw  ASSESSMENT AND PLAN:  Active Problems:   Pneumonia due to COVID-19 virus   Atrial fibrillation with RVR (HCC)  COVID 19 pneumonia Fatigue  Weakness . Plan Agree with admission Continue suppimental 02 Maintain antibx/Steroids/ Inhalers Anticougalaution for AFIB/Covid Supplimental medical therapy Consider ECHO for myocarditis AFIB      Yolonda Kida, MD 01/18/2020 11:10 PM

## 2020-01-18 NOTE — Progress Notes (Signed)
Attempted to ambulate with pt. To bathrrom, o2 sats dropped to 87% and pt. Became light headed. BP 126/66(82). Oxygen titrated up to 4L [place] pt now 92%

## 2020-01-19 LAB — COMPREHENSIVE METABOLIC PANEL
ALT: 29 U/L (ref 0–44)
AST: 27 U/L (ref 15–41)
Albumin: 3.2 g/dL — ABNORMAL LOW (ref 3.5–5.0)
Alkaline Phosphatase: 41 U/L (ref 38–126)
Anion gap: 9 (ref 5–15)
BUN: 27 mg/dL — ABNORMAL HIGH (ref 8–23)
CO2: 30 mmol/L (ref 22–32)
Calcium: 8.5 mg/dL — ABNORMAL LOW (ref 8.9–10.3)
Chloride: 102 mmol/L (ref 98–111)
Creatinine, Ser: 0.79 mg/dL (ref 0.61–1.24)
GFR calc Af Amer: >60 mL/min (ref >60)
GFR calc non Af Amer: >60 mL/min (ref >60)
Glucose, Bld: 172 mg/dL — ABNORMAL HIGH (ref 70–99)
Potassium: 4.3 mmol/L (ref 3.5–5.1)
Sodium: 141 mmol/L (ref 135–145)
Total Bilirubin: 0.7 mg/dL (ref 0.3–1.2)
Total Protein: 6.7 g/dL (ref 6.5–8.1)

## 2020-01-19 LAB — CBC WITH DIFFERENTIAL/PLATELET
Abs Immature Granulocytes: 0.06 10*3/uL (ref 0.00–0.07)
Basophils Absolute: 0 10*3/uL (ref 0.0–0.1)
Basophils Relative: 0 %
Eosinophils Absolute: 0 10*3/uL (ref 0.0–0.5)
Eosinophils Relative: 0 %
HCT: 44.1 % (ref 39.0–52.0)
Hemoglobin: 14.6 g/dL (ref 13.0–17.0)
Immature Granulocytes: 1 %
Lymphocytes Relative: 16 %
Lymphs Abs: 1.3 10*3/uL (ref 0.7–4.0)
MCH: 29.8 pg (ref 26.0–34.0)
MCHC: 33.1 g/dL (ref 30.0–36.0)
MCV: 90 fL (ref 80.0–100.0)
Monocytes Absolute: 0.7 10*3/uL (ref 0.1–1.0)
Monocytes Relative: 8 %
Neutro Abs: 6 10*3/uL (ref 1.7–7.7)
Neutrophils Relative %: 75 %
Platelets: 249 10*3/uL (ref 150–400)
RBC: 4.9 MIL/uL (ref 4.22–5.81)
RDW: 13.2 % (ref 11.5–15.5)
Smear Review: NORMAL
WBC: 8 10*3/uL (ref 4.0–10.5)
nRBC: 0 % (ref 0.0–0.2)

## 2020-01-19 LAB — FERRITIN: Ferritin: 1232 ng/mL — ABNORMAL HIGH (ref 24–336)

## 2020-01-19 LAB — FIBRIN DERIVATIVES D-DIMER (ARMC ONLY): Fibrin derivatives D-dimer (ARMC): 827.04 ng/mL (FEU) — ABNORMAL HIGH (ref 0.00–499.00)

## 2020-01-19 LAB — PHOSPHORUS: Phosphorus: 2.9 mg/dL (ref 2.5–4.6)

## 2020-01-19 LAB — MAGNESIUM: Magnesium: 2.2 mg/dL (ref 1.7–2.4)

## 2020-01-19 LAB — T4, FREE: Free T4: 0.99 ng/dL (ref 0.61–1.12)

## 2020-01-19 LAB — C-REACTIVE PROTEIN: CRP: 2.3 mg/dL — ABNORMAL HIGH (ref <1.0)

## 2020-01-19 MED ORDER — FUROSEMIDE 10 MG/ML IJ SOLN
40.0000 mg | Freq: Once | INTRAMUSCULAR | Status: AC
  Administered 2020-01-19: 40 mg via INTRAVENOUS
  Filled 2020-01-19: qty 4

## 2020-01-19 NOTE — Progress Notes (Signed)
1        [place] at [facility]   PATIENT NAME: Danny Carson    MR#:  [id number]  DATE OF BIRTH:  12/16/1955  SUBJECTIVE:  CHIEF COMPLAINT:   Chief Complaint  Patient presents with  . Covid Exposure  sob, cough +, afebrile now.  Requiring 4 L oxygen via nasal cannula REVIEW OF SYSTEMS:  Review of Systems  Constitutional: Negative for diaphoresis, fever, malaise/fatigue and weight loss.  HENT: Negative for ear discharge, ear pain, hearing loss, nosebleeds, sore throat and tinnitus.   Eyes: Negative for blurred vision and pain.  Respiratory: Positive for cough and shortness of breath. Negative for hemoptysis and wheezing.   Cardiovascular: Negative for chest pain, palpitations, orthopnea and leg swelling.  Gastrointestinal: Negative for abdominal pain, blood in stool, constipation, diarrhea, heartburn, nausea and vomiting.  Genitourinary: Negative for dysuria, frequency and urgency.  Musculoskeletal: Negative for back pain and myalgias.  Skin: Negative for itching and rash.  Neurological: Negative for dizziness, tingling, tremors, focal weakness, seizures, weakness and headaches.  Psychiatric/Behavioral: Negative for depression. The patient is not nervous/anxious.    DRUG ALLERGIES:   Allergies  Allergen Reactions  . Latex Swelling  . Ace Inhibitors Cough   VITALS:  Blood pressure 138/71, pulse 99, temperature 98 F (36.7 C), temperature source Oral, resp. rate (!) 22, height 6' (1.829 m), weight 104.3 kg, SpO2 91 %. PHYSICAL EXAMINATION:  Physical Exam HENT:     Head: Normocephalic and atraumatic.  Eyes:     Conjunctiva/sclera: Conjunctivae normal.     Pupils: Pupils are equal, round, and reactive to light.  Neck:     Thyroid: No thyromegaly.     Trachea: No tracheal deviation.  Cardiovascular:     Rate and Rhythm: Normal rate and regular rhythm.     Heart sounds: Normal heart sounds.  Pulmonary:     Effort: Pulmonary effort is normal. No respiratory  distress.     Breath sounds: Normal breath sounds. No wheezing.  Chest:     Chest wall: No tenderness.  Abdominal:     General: Bowel sounds are normal. There is no distension.     Palpations: Abdomen is soft.     Tenderness: There is no abdominal tenderness.  Musculoskeletal:        General: Normal range of motion.     Cervical back: Normal range of motion and neck supple.  Skin:    General: Skin is warm and dry.     Findings: No rash.  Neurological:     Mental Status: He is alert and oriented to person, place, and time.     Cranial Nerves: No cranial nerve deficit.    LABORATORY PANEL:  Male CBC Recent Labs  Lab 01/19/20 0453  WBC 8.0  HGB 14.6  HCT 44.1  PLT 249   ------------------------------------------------------------------------------------------------------------------ Chemistries  Recent Labs  Lab 01/19/20 0453  NA 141  K 4.3  CL 102  CO2 30  GLUCOSE 172*  BUN 27*  CREATININE 0.79  CALCIUM 8.5*  MG 2.2  AST 27  ALT 29  ALKPHOS 41  BILITOT 0.7   RADIOLOGY:  No results found. ASSESSMENT AND PLAN:   COVID-19 pneumonia - remdesivir day 3/5 - Decadron 6 mg IV daily - on 4 liters oxygen via nasal cannula.  Wean as tolerated Continue oxygen supplementation to maintain pulse oximetry greater than 92% Antitussives.  Will give 40 mg of IV Lasix once today to help diurese him and wean his oxygen  Atrial fibrillation rapid ventricular rate - Appears to be new onset - Cardizem 120 mg for rate control TSH low.  Will check free T3 and T4 Cardiology input appreciated Patient has a CHADS2VASC score of 0 and ideally does not require long-term anticoagulation as primary prophylaxis for an acute stroke - aspirin 81 mg daily -CT angiogram neg for acute PE   Status is: Inpatient  Remains inpatient appropriate because:Inpatient level of care appropriate due to severity of illness   Dispo: The patient is from: Home              Anticipated d/c is to:  Home              Anticipated d/c date is: 2 days              Patient currently is not medically stable to d/c.     DVT prophylaxis: Lovenox Family Communication: discussed with patient   All the records are reviewed and case discussed with Care Management/Social Worker. Management plans discussed with the patient, nursing and they are in agreement.  CODE STATUS: Full Code  TOTAL TIME TAKING CARE OF THIS PATIENT: 35 minutes.   More than 50% of the time was spent in counseling/coordination of care: YES  POSSIBLE D/C IN 2 DAYS, DEPENDING ON CLINICAL CONDITION.   Max Sane M.D on 01/19/2020 at 11:26 AM  [facility]   CC: Primary care physician; Hortencia Pilar, MD  Note: This dictation was prepared with Dragon dictation along with smaller phrase technology. Any transcriptional errors that result from this process are unintentional.

## 2020-01-19 NOTE — Progress Notes (Signed)
PT Cancellation Note  Patient Details Name: Danny Carson MRN: [id number] DOB: [birth year]   Cancelled Treatment:    Reason Eval/Treat Not Completed: Other (comment). Per conversation c RN/OT, pt is Independent c functional mobility but desats easily c moderate exertion. IS provided to room and pt denies any skilled acute PT needs at this time. Will sign off, please re-consult if needs arise.   Samy Ryner 01/19/2020, 2:22 PM  Elizabeth Palau, PT, DPT [phone]

## 2020-01-19 NOTE — Progress Notes (Signed)
OT Cancellation Note  Patient Details Name: Danny Carson MRN: [id number] DOB: 1956-06-12   Cancelled Treatment:    Reason Eval/Treat Not Completed: OT screened, no needs identified, will sign off. Per conversation c RN, pt is Independent c functional mobility but desats easily c moderate exertion. IS provided to room and pt denies any skilled acute OT needs at this time. Will sign off, please re-consult if needs arise.  Kathie Dike, M.S. OTR/L  01/19/20, 2:06 PM

## 2020-01-20 LAB — MAGNESIUM: Magnesium: 2.1 mg/dL (ref 1.7–2.4)

## 2020-01-20 LAB — CBC WITH DIFFERENTIAL/PLATELET
Abs Immature Granulocytes: 0.06 10*3/uL (ref 0.00–0.07)
Basophils Absolute: 0 10*3/uL (ref 0.0–0.1)
Basophils Relative: 0 %
Eosinophils Absolute: 0 10*3/uL (ref 0.0–0.5)
Eosinophils Relative: 0 %
HCT: 44.7 % (ref 39.0–52.0)
Hemoglobin: 15.4 g/dL (ref 13.0–17.0)
Immature Granulocytes: 1 %
Lymphocytes Relative: 16 %
Lymphs Abs: 1.8 10*3/uL (ref 0.7–4.0)
MCH: 29.7 pg (ref 26.0–34.0)
MCHC: 34.5 g/dL (ref 30.0–36.0)
MCV: 86.1 fL (ref 80.0–100.0)
Monocytes Absolute: 0.9 10*3/uL (ref 0.1–1.0)
Monocytes Relative: 8 %
Neutro Abs: 8.6 10*3/uL — ABNORMAL HIGH (ref 1.7–7.7)
Neutrophils Relative %: 75 %
Platelets: 316 10*3/uL (ref 150–400)
RBC: 5.19 MIL/uL (ref 4.22–5.81)
RDW: 13.2 % (ref 11.5–15.5)
Smear Review: NORMAL
WBC: 11.3 10*3/uL — ABNORMAL HIGH (ref 4.0–10.5)
nRBC: 0 % (ref 0.0–0.2)

## 2020-01-20 LAB — COMPREHENSIVE METABOLIC PANEL
ALT: 31 U/L (ref 0–44)
AST: 29 U/L (ref 15–41)
Albumin: 3.2 g/dL — ABNORMAL LOW (ref 3.5–5.0)
Alkaline Phosphatase: 45 U/L (ref 38–126)
Anion gap: 11 (ref 5–15)
BUN: 29 mg/dL — ABNORMAL HIGH (ref 8–23)
CO2: 30 mmol/L (ref 22–32)
Calcium: 8.5 mg/dL — ABNORMAL LOW (ref 8.9–10.3)
Chloride: 101 mmol/L (ref 98–111)
Creatinine, Ser: 0.7 mg/dL (ref 0.61–1.24)
GFR calc Af Amer: >60 mL/min (ref >60)
GFR calc non Af Amer: >60 mL/min (ref >60)
Glucose, Bld: 151 mg/dL — ABNORMAL HIGH (ref 70–99)
Potassium: 3.9 mmol/L (ref 3.5–5.1)
Sodium: 142 mmol/L (ref 135–145)
Total Bilirubin: 1 mg/dL (ref 0.3–1.2)
Total Protein: 6.7 g/dL (ref 6.5–8.1)

## 2020-01-20 LAB — FIBRIN DERIVATIVES D-DIMER (ARMC ONLY): Fibrin derivatives D-dimer (ARMC): 629.3 ng/mL (FEU) — ABNORMAL HIGH (ref 0.00–499.00)

## 2020-01-20 LAB — C-REACTIVE PROTEIN: CRP: 0.8 mg/dL (ref <1.0)

## 2020-01-20 LAB — PHOSPHORUS: Phosphorus: 3 mg/dL (ref 2.5–4.6)

## 2020-01-20 LAB — FERRITIN: Ferritin: 987 ng/mL — ABNORMAL HIGH (ref 24–336)

## 2020-01-20 MED ORDER — FUROSEMIDE 10 MG/ML IJ SOLN
40.0000 mg | Freq: Once | INTRAMUSCULAR | Status: AC
  Administered 2020-01-20: 18:00:00 40 mg via INTRAVENOUS
  Filled 2020-01-20: qty 4

## 2020-01-20 NOTE — Progress Notes (Signed)
OT Cancellation Note  Patient Details Name: Danny Carson MRN: [id number] DOB: 1956-03-06   Cancelled Treatment:    Reason Eval/Treat Not Completed: OT screened, no needs identified, will sign off. Spoke with RN who reports pt continues to be independent, just has O2 drop with moderate exertional activity. Will sign off. Please re-consult if additional skilled OT needs arise.   Richrd Prime, MPH, MS, OTR/L ascom [phone] 01/20/20, 10:20 AM

## 2020-01-20 NOTE — Progress Notes (Signed)
1        [place] [facility]   PATIENT NAME: Danny Carson    MR#:  [id number]  DATE OF BIRTH:  [birth year]  SUBJECTIVE:  CHIEF COMPLAINT:   Chief Complaint  Patient presents with  . Covid Exposure  sob, cough +, afebrile now.  Requiring 5 L oxygen via nasal cannula.  Dyspneic/hypoxic yesterday on minimal exertion REVIEW OF SYSTEMS:  Review of Systems  Constitutional: Negative for diaphoresis, fever, malaise/fatigue and weight loss.  HENT: Negative for ear discharge, ear pain, hearing loss, nosebleeds, sore throat and tinnitus.   Eyes: Negative for blurred vision and pain.  Respiratory: Positive for cough and shortness of breath. Negative for hemoptysis and wheezing.   Cardiovascular: Negative for chest pain, palpitations, orthopnea and leg swelling.  Gastrointestinal: Negative for abdominal pain, blood in stool, constipation, diarrhea, heartburn, nausea and vomiting.  Genitourinary: Negative for dysuria, frequency and urgency.  Musculoskeletal: Negative for back pain and myalgias.  Skin: Negative for itching and rash.  Neurological: Negative for dizziness, tingling, tremors, focal weakness, seizures, weakness and headaches.  Psychiatric/Behavioral: Negative for depression. The patient is not nervous/anxious.    DRUG ALLERGIES:   Allergies  Allergen Reactions  . Latex Swelling  . Ace Inhibitors Cough   VITALS:  Blood pressure 122/86, pulse 80, temperature 97.9 F (36.6 C), temperature source Oral, resp. rate (!) 25, height 6' (1.829 m), weight 104.3 kg, SpO2 91 %. PHYSICAL EXAMINATION:  Physical Exam HENT:     Head: Normocephalic and atraumatic.  Eyes:     Conjunctiva/sclera: Conjunctivae normal.     Pupils: Pupils are equal, round, and reactive to light.  Neck:     Thyroid: No thyromegaly.     Trachea: No tracheal deviation.  Cardiovascular:     Rate and Rhythm: Normal rate and regular rhythm.     Heart sounds: Normal heart sounds.  Pulmonary:   Effort: Pulmonary effort is normal. No respiratory distress.     Breath sounds: Normal breath sounds. No wheezing.  Chest:     Chest wall: No tenderness.  Abdominal:     General: Bowel sounds are normal. There is no distension.     Palpations: Abdomen is soft.     Tenderness: There is no abdominal tenderness.  Musculoskeletal:        General: Normal range of motion.     Cervical back: Normal range of motion and neck supple.  Skin:    General: Skin is warm and dry.     Findings: No rash.  Neurological:     Mental Status: He is alert and oriented to person, place, and time.     Cranial Nerves: No cranial nerve deficit.    LABORATORY PANEL:  Male CBC Recent Labs  Lab 01/20/20 0610  WBC 11.3*  HGB 15.4  HCT 44.7  PLT 316   ------------------------------------------------------------------------------------------------------------------ Chemistries  Recent Labs  Lab 01/20/20 0610  NA 142  K 3.9  CL 101  CO2 30  GLUCOSE 151*  BUN 29*  CREATININE 0.70  CALCIUM 8.5*  MG 2.1  AST 29  ALT 31  ALKPHOS 45  BILITOT 1.0   RADIOLOGY:  No results found. ASSESSMENT AND PLAN:   COVID-19 pneumonia - remdesivir day 4/5 - Decadron 6 mg IV daily - on 5 liters oxygen via nasal cannula.  Wean as tolerated.  He got very dyspneic/hypoxic yesterday on minimal exertion Continue oxygen supplementation to maintain pulse oximetry greater than 92% Antitussives.  Trying 40 mg of IV  Lasix once today to help diurese him and wean his oxygen  Atrial fibrillation rapid ventricular rate - Appears to be new onset - Cardizem 120 mg for rate control TSH low.  Normal free T3 and T4 Cardiology input appreciated Patient has a CHADS2VASC score of 0 and ideally does not require long-term anticoagulation as primary prophylaxis for an acute stroke - aspirin 81 mg daily -CT angiogram neg for acute PE   Status is: Inpatient  Remains inpatient appropriate because:Inpatient level of care  appropriate due to severity of illness   Dispo: The patient is from: Home              Anticipated d/c is to: Home              Anticipated d/c date is: 1 day              Patient currently is not medically stable to d/c.  Difficulty is to wean him off oxygen.  Currently he is still requiring 5 L oxygen   DVT prophylaxis: Lovenox Family Communication: discussed with patient   All the records are reviewed and case discussed with Care Management/Social Worker. Management plans discussed with the patient, nursing and they are in agreement.  CODE STATUS: Full Code  TOTAL TIME TAKING CARE OF THIS PATIENT: 35 minutes.   More than 50% of the time was spent in counseling/coordination of care: YES  POSSIBLE D/C IN 1-2 DAYS, DEPENDING ON CLINICAL CONDITION.   Max Sane M.D on 01/20/2020 at 3:35 PM  [facility]   CC: Primary care physician; Hortencia Pilar, MD  Note: This dictation was prepared with Dragon dictation along with smaller phrase technology. Any transcriptional errors that result from this process are unintentional.

## 2020-01-21 LAB — CBC WITH DIFFERENTIAL/PLATELET
Abs Immature Granulocytes: 0.12 10*3/uL — ABNORMAL HIGH (ref 0.00–0.07)
Basophils Absolute: 0 10*3/uL (ref 0.0–0.1)
Basophils Relative: 0 %
Eosinophils Absolute: 0 10*3/uL (ref 0.0–0.5)
Eosinophils Relative: 0 %
HCT: 43.4 % (ref 39.0–52.0)
Hemoglobin: 15 g/dL (ref 13.0–17.0)
Immature Granulocytes: 1 %
Lymphocytes Relative: 14 %
Lymphs Abs: 1.6 10*3/uL (ref 0.7–4.0)
MCH: 29.7 pg (ref 26.0–34.0)
MCHC: 34.6 g/dL (ref 30.0–36.0)
MCV: 85.9 fL (ref 80.0–100.0)
Monocytes Absolute: 0.8 10*3/uL (ref 0.1–1.0)
Monocytes Relative: 7 %
Neutro Abs: 9.2 10*3/uL — ABNORMAL HIGH (ref 1.7–7.7)
Neutrophils Relative %: 78 %
Platelets: 331 10*3/uL (ref 150–400)
RBC: 5.05 MIL/uL (ref 4.22–5.81)
RDW: 13.1 % (ref 11.5–15.5)
Smear Review: NORMAL
WBC: 11.8 10*3/uL — ABNORMAL HIGH (ref 4.0–10.5)
nRBC: 0 % (ref 0.0–0.2)

## 2020-01-21 LAB — COMPREHENSIVE METABOLIC PANEL
ALT: 43 U/L (ref 0–44)
AST: 31 U/L (ref 15–41)
Albumin: 3.1 g/dL — ABNORMAL LOW (ref 3.5–5.0)
Alkaline Phosphatase: 41 U/L (ref 38–126)
Anion gap: 9 (ref 5–15)
BUN: 29 mg/dL — ABNORMAL HIGH (ref 8–23)
CO2: 30 mmol/L (ref 22–32)
Calcium: 8.4 mg/dL — ABNORMAL LOW (ref 8.9–10.3)
Chloride: 100 mmol/L (ref 98–111)
Creatinine, Ser: 0.81 mg/dL (ref 0.61–1.24)
GFR calc Af Amer: >60 mL/min (ref >60)
GFR calc non Af Amer: >60 mL/min (ref >60)
Glucose, Bld: 183 mg/dL — ABNORMAL HIGH (ref 70–99)
Potassium: 3.8 mmol/L (ref 3.5–5.1)
Sodium: 139 mmol/L (ref 135–145)
Total Bilirubin: 0.7 mg/dL (ref 0.3–1.2)
Total Protein: 6.5 g/dL (ref 6.5–8.1)

## 2020-01-21 LAB — C-REACTIVE PROTEIN: CRP: <0.5 mg/dL (ref <1.0)

## 2020-01-21 LAB — FERRITIN: Ferritin: 926 ng/mL — ABNORMAL HIGH (ref 24–336)

## 2020-01-21 LAB — MAGNESIUM: Magnesium: 2.2 mg/dL (ref 1.7–2.4)

## 2020-01-21 LAB — PHOSPHORUS: Phosphorus: 2.8 mg/dL (ref 2.5–4.6)

## 2020-01-21 LAB — T3, FREE: T3, Free: 1.6 pg/mL — ABNORMAL LOW (ref 2.0–4.4)

## 2020-01-21 LAB — FIBRIN DERIVATIVES D-DIMER (ARMC ONLY): Fibrin derivatives D-dimer (ARMC): 548.84 ng/mL (FEU) — ABNORMAL HIGH (ref 0.00–499.00)

## 2020-01-21 MED ORDER — FUROSEMIDE 10 MG/ML IJ SOLN
40.0000 mg | INTRAMUSCULAR | Status: AC
  Administered 2020-01-21: 40 mg via INTRAVENOUS
  Filled 2020-01-21: qty 4

## 2020-01-21 NOTE — Progress Notes (Signed)
1        [place] at [facility]   PATIENT NAME: Danny Carson    MR#:  [id number]  DATE OF BIRTH:  [birth year]  SUBJECTIVE:  CHIEF COMPLAINT:   Chief Complaint  Patient presents with  . Covid Exposure  patient wanted to go and was planned for D/C today but on minimal exertion/ambulated by RN and he got very dyspneic and got in rapid a.fib with HR in 140-150s so D/C held REVIEW OF SYSTEMS:  Review of Systems  Constitutional: Negative for diaphoresis, fever, malaise/fatigue and weight loss.  HENT: Negative for ear discharge, ear pain, hearing loss, nosebleeds, sore throat and tinnitus.   Eyes: Negative for blurred vision and pain.  Respiratory: Positive for cough and shortness of breath. Negative for hemoptysis and wheezing.   Cardiovascular: Negative for chest pain, palpitations, orthopnea and leg swelling.  Gastrointestinal: Negative for abdominal pain, blood in stool, constipation, diarrhea, heartburn, nausea and vomiting.  Genitourinary: Negative for dysuria, frequency and urgency.  Musculoskeletal: Negative for back pain and myalgias.  Skin: Negative for itching and rash.  Neurological: Negative for dizziness, tingling, tremors, focal weakness, seizures, weakness and headaches.  Psychiatric/Behavioral: Negative for depression. The patient is not nervous/anxious.    DRUG ALLERGIES:   Allergies  Allergen Reactions  . Latex Swelling  . Ace Inhibitors Cough   VITALS:  Blood pressure (!) 154/79, pulse 86, temperature (!) 97.5 F (36.4 C), temperature source Oral, resp. rate 17, height 6' (1.829 m), weight 104.3 kg, SpO2 93 %. PHYSICAL EXAMINATION:  Physical Exam HENT:     Head: Normocephalic and atraumatic.  Eyes:     Conjunctiva/sclera: Conjunctivae normal.     Pupils: Pupils are equal, round, and reactive to light.  Neck:     Thyroid: No thyromegaly.     Trachea: No tracheal deviation.  Cardiovascular:     Rate and Rhythm: Normal rate and regular rhythm.       Heart sounds: Normal heart sounds.  Pulmonary:     Effort: Pulmonary effort is normal. No respiratory distress.     Breath sounds: Normal breath sounds. No wheezing.  Chest:     Chest wall: No tenderness.  Abdominal:     General: Bowel sounds are normal. There is no distension.     Palpations: Abdomen is soft.     Tenderness: There is no abdominal tenderness.  Musculoskeletal:        General: Normal range of motion.     Cervical back: Normal range of motion and neck supple.  Skin:    General: Skin is warm and dry.     Findings: No rash.  Neurological:     Mental Status: He is alert and oriented to person, place, and time.     Cranial Nerves: No cranial nerve deficit.    LABORATORY PANEL:  Male CBC Recent Labs  Lab 01/21/20 0544  WBC 11.8*  HGB 15.0  HCT 43.4  PLT 331   ------------------------------------------------------------------------------------------------------------------ Chemistries  Recent Labs  Lab 01/21/20 0544  NA 139  K 3.8  CL 100  CO2 30  GLUCOSE 183*  BUN 29*  CREATININE 0.81  CALCIUM 8.4*  MG 2.2  AST 31  ALT 43  ALKPHOS 41  BILITOT 0.7   RADIOLOGY:  No results found. ASSESSMENT AND PLAN:   COVID-19 pneumonia - remdesivir day 5/5 - Decadron 6 mg IV daily (5/10) - weaned off to 2 liters but when nurse ambulated got very dyspneic/hypoxic on minimal exertion - Antitussives.  Diuresed him over last 2-3 days, neg 3.5 liters  Atrial fibrillation rapid ventricular rate -  new onset - Cardizem 120 mg for rate control TSH low.  Normal free T3 and T4 Cardiology input appreciated, no f/up since 4/16th. Have requested Dr Clayborn Bigness to f/up Patient has a CHADS2VASC score of 0 and ideally does not require long-term anticoagulation as primary prophylaxis for an acute stroke - aspirin 81 mg daily -CT angiogram neg for acute PE  Status is: Inpatient  Remains inpatient appropriate because:Inpatient level of care appropriate due to severity  of illness   Dispo: The patient is from: Home              Anticipated d/c is to: Home              Anticipated d/c date is: 1 day              Patient currently is not medically stable to d/c.  Get dyspneic on minimal exertion and in rapid a.fib (HR in 140-150s). await cardio f/up    DVT prophylaxis: Lovenox Family Communication: discussed with patient   All the records are reviewed and case discussed with Care Management/Social Worker. Management plans discussed with the patient, nursing and they are in agreement.  CODE STATUS: Full Code  TOTAL TIME TAKING CARE OF THIS PATIENT: 35 minutes.   More than 50% of the time was spent in counseling/coordination of care: YES  POSSIBLE D/C IN 1-2 DAYS, DEPENDING ON CLINICAL CONDITION.   Max Sane M.D on 01/21/2020 at 9:50 PM  [facility]   CC: Primary care physician; Hortencia Pilar, MD  Note: This dictation was prepared with Dragon dictation along with smaller phrase technology. Any transcriptional errors that result from this process are unintentional.

## 2020-01-21 NOTE — Plan of Care (Signed)
  Problem: Education: Goal: Knowledge of risk factors and measures for prevention of condition will improve Outcome: Progressing   Problem: Coping: Goal: Psychosocial and spiritual needs will be supported Outcome: Progressing   

## 2020-01-22 DIAGNOSIS — J9601 Acute respiratory failure with hypoxia: Secondary | ICD-10-CM

## 2020-01-22 LAB — COMPREHENSIVE METABOLIC PANEL
ALT: 43 U/L (ref 0–44)
AST: 26 U/L (ref 15–41)
Albumin: 2.9 g/dL — ABNORMAL LOW (ref 3.5–5.0)
Alkaline Phosphatase: 45 U/L (ref 38–126)
Anion gap: 9 (ref 5–15)
BUN: 32 mg/dL — ABNORMAL HIGH (ref 8–23)
CO2: 30 mmol/L (ref 22–32)
Calcium: 8.7 mg/dL — ABNORMAL LOW (ref 8.9–10.3)
Chloride: 101 mmol/L (ref 98–111)
Creatinine, Ser: 0.82 mg/dL (ref 0.61–1.24)
GFR calc Af Amer: >60 mL/min (ref >60)
GFR calc non Af Amer: >60 mL/min (ref >60)
Glucose, Bld: 161 mg/dL — ABNORMAL HIGH (ref 70–99)
Potassium: 4.1 mmol/L (ref 3.5–5.1)
Sodium: 140 mmol/L (ref 135–145)
Total Bilirubin: 0.7 mg/dL (ref 0.3–1.2)
Total Protein: 6.4 g/dL — ABNORMAL LOW (ref 6.5–8.1)

## 2020-01-22 LAB — CULTURE, BLOOD (ROUTINE X 2)
Culture: NO GROWTH
Culture: NO GROWTH

## 2020-01-22 LAB — CBC WITH DIFFERENTIAL/PLATELET
Abs Immature Granulocytes: 0.18 10*3/uL — ABNORMAL HIGH (ref 0.00–0.07)
Basophils Absolute: 0 10*3/uL (ref 0.0–0.1)
Basophils Relative: 0 %
Eosinophils Absolute: 0 10*3/uL (ref 0.0–0.5)
Eosinophils Relative: 0 %
HCT: 45.8 % (ref 39.0–52.0)
Hemoglobin: 15.7 g/dL (ref 13.0–17.0)
Immature Granulocytes: 1 %
Lymphocytes Relative: 12 %
Lymphs Abs: 1.8 10*3/uL (ref 0.7–4.0)
MCH: 29.5 pg (ref 26.0–34.0)
MCHC: 34.3 g/dL (ref 30.0–36.0)
MCV: 85.9 fL (ref 80.0–100.0)
Monocytes Absolute: 1.1 10*3/uL — ABNORMAL HIGH (ref 0.1–1.0)
Monocytes Relative: 8 %
Neutro Abs: 11.4 10*3/uL — ABNORMAL HIGH (ref 1.7–7.7)
Neutrophils Relative %: 79 %
Platelets: 369 10*3/uL (ref 150–400)
RBC: 5.33 MIL/uL (ref 4.22–5.81)
RDW: 13.1 % (ref 11.5–15.5)
Smear Review: NORMAL
WBC: 14.5 10*3/uL — ABNORMAL HIGH (ref 4.0–10.5)
nRBC: 0 % (ref 0.0–0.2)

## 2020-01-22 LAB — FERRITIN: Ferritin: 842 ng/mL — ABNORMAL HIGH (ref 24–336)

## 2020-01-22 LAB — C-REACTIVE PROTEIN: CRP: 0.5 mg/dL (ref <1.0)

## 2020-01-22 LAB — TROPONIN I (HIGH SENSITIVITY)
Troponin I (High Sensitivity): 15 ng/L (ref <18)
Troponin I (High Sensitivity): 7 ng/L (ref <18)

## 2020-01-22 LAB — FIBRIN DERIVATIVES D-DIMER (ARMC ONLY): Fibrin derivatives D-dimer (ARMC): 474.43 ng/mL (FEU) (ref 0.00–499.00)

## 2020-01-22 LAB — PHOSPHORUS: Phosphorus: 3 mg/dL (ref 2.5–4.6)

## 2020-01-22 LAB — MAGNESIUM: Magnesium: 2.3 mg/dL (ref 1.7–2.4)

## 2020-01-22 MED ORDER — APIXABAN 5 MG PO TABS
5.0000 mg | ORAL_TABLET | Freq: Two times a day (BID) | ORAL | Status: DC
  Administered 2020-01-22: 5 mg via ORAL
  Filled 2020-01-22: qty 1

## 2020-01-22 MED ORDER — METOPROLOL TARTRATE 50 MG PO TABS: 50.0000 mg | ORAL_TABLET | Freq: Two times a day (BID) | ORAL | 0 refills | Status: DC

## 2020-01-22 MED ORDER — ACETAMINOPHEN 325 MG PO TABS: 650.0000 mg | ORAL_TABLET | Freq: Four times a day (QID) | ORAL | Status: AC | PRN

## 2020-01-22 MED ORDER — METOPROLOL TARTRATE 25 MG PO TABS
25.0000 mg | ORAL_TABLET | Freq: Once | ORAL | Status: AC
  Administered 2020-01-22: 15:00:00 25 mg via ORAL
  Filled 2020-01-22: qty 1

## 2020-01-22 MED ORDER — FUROSEMIDE 10 MG/ML IJ SOLN
40.0000 mg | Freq: Once | INTRAMUSCULAR | Status: AC
  Administered 2020-01-22: 40 mg via INTRAVENOUS
  Filled 2020-01-22: qty 4

## 2020-01-22 MED ORDER — DEXAMETHASONE 6 MG PO TABS: 6.0000 mg | ORAL_TABLET | Freq: Every day | ORAL | 0 refills | Status: AC

## 2020-01-22 MED ORDER — METOPROLOL TARTRATE 50 MG PO TABS: 50.0000 mg | ORAL_TABLET | Freq: Two times a day (BID) | ORAL | Status: DC

## 2020-01-22 MED ORDER — APIXABAN 5 MG PO TABS: 5.0000 mg | ORAL_TABLET | Freq: Two times a day (BID) | ORAL | 0 refills | Status: DC

## 2020-01-22 MED ORDER — METOPROLOL TARTRATE 25 MG PO TABS
25.0000 mg | ORAL_TABLET | Freq: Two times a day (BID) | ORAL | Status: DC
  Administered 2020-01-22: 25 mg via ORAL
  Filled 2020-01-22: qty 1

## 2020-01-22 NOTE — Plan of Care (Signed)
Pt goals met for discharge

## 2020-01-22 NOTE — Discharge Summary (Signed)
Physician Discharge Summary  Danny Carson LPF:[id number] DOB: 1956/06/30 DOA: 01/17/2020  PCP: Danny Gala, MD  Admit date: 01/17/2020 Discharge date: 01/22/2020  Admitted From: Home Disposition:  Home  Recommendations for Outpatient Follow-up:  1. Follow up with PCP in 1-2 weeks 2. Follow up with Dr. Juliann Carson (cardiology) in 2 weeks  Home Health:No Equipment/Devices:None Discharge Condition:Stable CODE STATUS: Full Diet recommendation: Heart Healthy  Brief/Interim Summary: Danny Carson a 64 y.o.malewith medical history significant forwho was diagnosed with COVID 19 viral infectionon January 13, 2020.He had symptoms 3 to 4 days prior to his diagnosisand multiple family members have infection with Covid 19 virus. He presents to the ER for evaluationof worsening shortness of breath and is now short of breath at rest.He has a cough which is productive of clear phlegm. He also complains of Fever/chills, pleuritic chest pain mostly left-sidedas well as poor oral intake.He denies having any nausea or vomiting, he denies having abdominal pain or any urinary symptoms. Twelve-lead EKG shows rapid atrial fibrillation. Chest x-ray showspatchy airspace disease throughout both lungs consistent with the provided history of COVID pneumonia.  4/21: Patient seen and examined.  Asks about going home.  Remains on 2 L supplemental oxygen.  Cardiology reevaluation appreciated.  Switch to metoprolol for rate control and Eliquis for VTE prophylaxis in setting of atrial fibrillation.  Increased dose of metoprolol to 50 mg twice daily.  Patient able to wean from supplemental oxygen.  Now heart rate controlled even with exertion.  Heart rate at rest in the 80s, on exertion up to 100.  Patient evaluated prior to discharge.  Respiratory status improved and heart rate improved to the point the patient is medically stable for discharge.  Discharge Diagnoses:  Active Problems:    Pneumonia due to COVID-19 virus   Atrial fibrillation with RVR (HCC) COVID-19 pneumonia - remdesivir day5/5, completed course - Decadron 6 mg IV daily(6/10), complete remainder of course on discharge -Weaned from supplemental oxygen -Home temperature monitoring ordered -Clear return to ED instructions provided   Atrial fibrillation rapid ventricular rate - new onset Cardiology reevaluation appreciated Metoprolol uptitrated to achieve goal rate of heart rate less than 100 Eliquis initiated, prescribed on discharge Patient will follow up with Dr. Juliann Carson from cardiology in 2 weeks   Discharge Instructions  Discharge Instructions    Call MD for:  difficulty breathing, headache or visual disturbances   Complete by: As directed    Call MD for:  extreme fatigue   Complete by: As directed    Call MD for:  temperature >100.4   Complete by: As directed    Diet - low sodium heart healthy   Complete by: As directed    Increase activity slowly   Complete by: As directed    MyChart COVID-19 home monitoring program   Complete by: Jan 22, 2020    Is the patient willing to use the MyChart Mobile App for home monitoring?: Yes   Temperature monitoring   Complete by: Jan 22, 2020    After how many days would you like to receive a notification of this patient's flowsheet entries?: 1     Allergies as of 01/22/2020      Reactions   Latex Swelling   Ace Inhibitors Cough      Medication List    STOP taking these medications   losartan 100 MG tablet Commonly known as: COZAAR     TAKE these medications   acetaminophen 325 MG tablet Commonly known as: TYLENOL Take 2  tablets (650 mg total) by mouth every 6 (six) hours as needed for mild pain or headache (fever >/= 101).   apixaban 5 MG Tabs tablet Commonly known as: ELIQUIS Take 1 tablet (5 mg total) by mouth 2 (two) times daily.   dexamethasone 6 MG tablet Commonly known as: Decadron Take 1 tablet (6 mg total) by mouth daily  for 5 days. Start taking on: January 23, 2020   hydrochlorothiazide 12.5 MG capsule Commonly known as: MICROZIDE Take 12.5 mg by mouth daily.   metoprolol tartrate 50 MG tablet Commonly known as: LOPRESSOR Take 1 tablet (50 mg total) by mouth 2 (two) times daily.            Durable Medical Equipment  (From admission, onward)         Start     Ordered   01/22/20 0851  For home use only DME oxygen  Once    Question Answer Comment  Length of Need 6 Months   Liters per Minute 2   Frequency Continuous (stationary and portable oxygen unit needed)   Oxygen conserving device Yes   Oxygen delivery system Gas      01/22/20 0851         Follow-up Information    Danny Gala, MD. Schedule an appointment as soon as possible for a visit in 1 week(s).   Specialty: Family Medicine Contact information: [address] [place] [address] [phone]        Danny Pea, MD. Schedule an appointment as soon as possible for a visit in 2 week(s).   Specialties: Cardiology, Internal Medicine Contact information: [address] [place] [address] [phone]          Allergies  Allergen Reactions  . Latex Swelling  . Ace Inhibitors Cough    Consultations:  Cardiology- [place]   Procedures/Studies: CT ANGIO CHEST PE W OR WO CONTRAST  Result Date: 01/17/2020 CLINICAL DATA:  64 year old male with 1 week of shortness of breath. Fever. Recently positive for COVID-19. EXAM: CT ANGIOGRAPHY CHEST WITH CONTRAST TECHNIQUE: Multidetector CT imaging of the chest was performed using the standard protocol during bolus administration of intravenous contrast. Multiplanar CT image reconstructions and MIPs were obtained to evaluate the vascular anatomy. CONTRAST:  OMNIPAQUE IOHEXOL 350 MG/ML SOLN COMPARISON:  Portable chest earlier today. FINDINGS: Cardiovascular: Good contrast bolus timing in the pulmonary arterial tree. Mild respiratory motion. No focal  filling defect identified in the pulmonary arteries to suggest acute pulmonary embolism. Moderate cardiomegaly. No pericardial effusion. Negative visible aorta. No definite calcified coronary artery atherosclerosis. Mediastinum/Nodes: Mild, reactive appearing mediastinal and bilateral hilar lymph nodes. Lungs/Pleura: Major airways are patent. Widespread bilateral peripheral and peribronchial patchy areas of ground-glass and more solid pulmonary opacity (e.g. Series 6, image 38). All lobes affected. No pleural effusion. Upper Abdomen: Mildly elevated right hemidiaphragm. Surgically absent gallbladder. Probable hepatic steatosis. Negative visible spleen, stomach and large bowel in the upper abdomen. Musculoskeletal: No acute osseous abnormality identified. Osteopenia with occasional chronic appearing mild thoracic compression fractures. Review of the MIP images confirms the above findings. IMPRESSION: 1. Negative for acute pulmonary embolus. 2. Peripheral and scattered patchy areas of ground-glass and solid pulmonary opacity in all lobes compatible with COVID-19 Pneumonia. No pleural effusion. 3. Reactive appearing mediastinal and bilateral hilar lymph nodes. 4. Moderate cardiomegaly. Electronically Signed   By: Odessa Fleming M.D.   On: 01/17/2020 13:30   DG Chest Port 1 View  Result Date: 01/17/2020 CLINICAL DATA:  COVID  pneumonia. Additional provided: Shortness of breath for 1 week, diagnosed with COVID 01/13/2020, fever EXAM: PORTABLE CHEST 1 VIEW COMPARISON:  Concurrently performed CT angiogram chest FINDINGS: Heart size within normal limits. Patchy airspace disease throughout both lungs consistent with provided history of COVID pneumonia. There is no evidence of pleural effusion or pneumothorax. No acute bony abnormality. IMPRESSION: Patchy airspace disease throughout both lungs consistent with the provided history of COVID pneumonia. Electronically Signed   By: Jackey Loge DO   On: 01/17/2020 13:15    ECHOCARDIOGRAM COMPLETE  Result Date: 01/17/2020    ECHOCARDIOGRAM REPORT   Patient Name:   Raydan YUREM VINER Date of Exam: 01/17/2020 Medical Rec #:  [id number]       Height:       72.0 in Accession #:    [id number]      Weight:       230.0 lb Date of Birth:  11-Dec-1955      BSA:          2.261 m Patient Age:    63 years        BP:           104/59 mmHg Patient Gender: M               HR:           129 bpm. Exam Location:  [facility] Procedure: 2D Echo, Color Doppler and Cardiac Doppler Indications:     Atrial Fibrillation 427.31  History:         Patient has no prior history of Echocardiogram examinations. No                  medical history on file.  Sonographer:     Cristela Blue RDCS (AE) Referring Phys:  [id number] Lucile Shutters Diagnosing Phys: Danny Pea MD IMPRESSIONS  1. Left ventricular ejection fraction, by estimation, is 50 to 55%. The left ventricle has low normal function. The left ventricle has no regional wall motion abnormalities. Left ventricular diastolic parameters are consistent with Grade I diastolic dysfunction (impaired relaxation).  2. Right ventricular systolic function is normal. The right ventricular size is normal. There is normal pulmonary artery systolic pressure.  3. The mitral valve is grossly normal. Mild mitral valve regurgitation.  4. The aortic valve is normal in structure. Aortic valve regurgitation is not visualized. FINDINGS  Left Ventricle: Left ventricular ejection fraction, by estimation, is 50 to 55%. The left ventricle has low normal function. The left ventricle has no regional wall motion abnormalities. The left ventricular internal cavity size was normal in size. There is no left ventricular hypertrophy. Left ventricular diastolic parameters are consistent with Grade I diastolic dysfunction (impaired relaxation). Right Ventricle: The right ventricular size is normal. No increase in right ventricular wall thickness. Right ventricular systolic function is normal. There is  normal pulmonary artery systolic pressure. The tricuspid regurgitant velocity is 1.59 m/s, and  with an assumed right atrial pressure of 10 mmHg, the estimated right ventricular systolic pressure is 20.1 mmHg. Left Atrium: Left atrial size was normal in size. Right Atrium: Right atrial size was normal in size. Pericardium: There is no evidence of pericardial effusion. Mitral Valve: The mitral valve is grossly normal. Mild mitral valve regurgitation. Tricuspid Valve: The tricuspid valve is normal in structure. Tricuspid valve regurgitation is mild. Aortic Valve: The aortic valve is normal in structure. Aortic valve regurgitation is not visualized. Aortic valve mean gradient measures 2.0 mmHg. Aortic valve peak gradient measures 4.0  mmHg. Aortic valve area, by VTI measures 4.03 cm. Pulmonic Valve: The pulmonic valve was grossly normal. Pulmonic valve regurgitation is not visualized. Aorta: The aortic root is normal in size and structure. IAS/Shunts: No atrial level shunt detected by color flow Doppler.  LEFT VENTRICLE PLAX 2D LVIDd:         4.00 cm LVIDs:         2.96 cm LV PW:         1.57 cm LV IVS:        1.89 cm LVOT diam:     2.10 cm LV SV:         51 LV SV Index:   22 LVOT Area:     3.46 cm  RIGHT VENTRICLE RV Basal diam:  3.71 cm RV S prime:     12.80 cm/s TAPSE (M-mode): 3.1 cm LEFT ATRIUM             Index       RIGHT ATRIUM           Index LA diam:        4.10 cm 1.81 cm/m  RA Area:     20.80 cm LA Vol (A2C):   60.8 ml 26.89 ml/m RA Volume:   59.10 ml  26.14 ml/m LA Vol (A4C):   42.2 ml 18.66 ml/m LA Biplane Vol: 51.8 ml 22.91 ml/m  AORTIC VALVE                   PULMONIC VALVE AV Area (Vmax):    2.69 cm    PV Vmax:        0.58 m/s AV Area (Vmean):   2.87 cm    PV Peak grad:   1.4 mmHg AV Area (VTI):     4.03 cm    RVOT Peak grad: 2 mmHg AV Vmax:           100.03 cm/s AV Vmean:          63.700 cm/s AV VTI:            0.125 m AV Peak Grad:      4.0 mmHg AV Mean Grad:      2.0 mmHg LVOT Vmax:          77.60 cm/s LVOT Vmean:        52.800 cm/s LVOT VTI:          0.146 m LVOT/AV VTI ratio: 1.16  AORTA Ao Root diam: 3.00 cm MITRAL VALVE               TRICUSPID VALVE MV Area (PHT): 3.60 cm    TR Peak grad:   10.1 mmHg MV Decel Time: 211 msec    TR Vmax:        159.00 cm/s MV E velocity: 77.60 cm/s                            SHUNTS                            Systemic VTI:  0.15 m                            Systemic Diam: 2.10 cm Dwayne Salome Arnt MD Electronically signed by Danny Pea MD Signature Date/Time: 01/17/2020/4:38:35 PM    Final     (Echo, Carotid,  EGD, Colonoscopy, ERCP)    Subjective: Seen and examined at the time of discharge No complaints, feels well Stable for dc home  Discharge Exam: Vitals:   01/22/20 0804 01/22/20 1523  BP: (!) 146/97 138/85  Pulse: (!) 110 96  Resp: (!) 21 (!) 22  Temp: (!) 97.5 F (36.4 C) 97.7 F (36.5 C)  SpO2: 91% 92%   Vitals:   01/22/20 0008 01/22/20 0300 01/22/20 0804 01/22/20 1523  BP: 136/61 136/70 (!) 146/97 138/85  Pulse: 67 81 (!) 110 96  Resp: 20 17 (!) 21 (!) 22  Temp: 97.9 F (36.6 C) 98.7 F (37.1 C) (!) 97.5 F (36.4 C) 97.7 F (36.5 C)  TempSrc:  Oral Oral Oral  SpO2: 92% 94% 91% 92%  Weight:      Height:        General: Pt is alert, awake, not in acute distress Cardiovascular: RRR, S1/S2 +, no rubs, no gallops Respiratory: CTA bilaterally, no wheezing, no rhonchi Abdominal: Soft, NT, ND, bowel sounds + Extremities: no edema, no cyanosis    The results of significant diagnostics from this hospitalization (including imaging, microbiology, ancillary and laboratory) are listed below for reference.     Microbiology: Recent Results (from the past 240 hour(s))  Blood Culture (routine x 2)     Status: None   Collection Time: 01/17/20 12:18 PM   Specimen: BLOOD  Result Value Ref Range Status   Specimen Description BLOOD LEFT ANTECUBITAL  Final   Special Requests   Final    BOTTLES DRAWN AEROBIC AND ANAEROBIC  Blood Culture results may not be optimal due to an inadequate volume of blood received in culture bottles   Culture   Final    NO GROWTH 5 DAYS Performed at [facility], [address] [place], [place] [address]    Report Status 01/22/2020 FINAL  Final  Blood Culture (routine x 2)     Status: None   Collection Time: 01/17/20 12:30 PM   Specimen: BLOOD  Result Value Ref Range Status   Specimen Description BLOOD BLOOD RIGHT HAND  Final   Special Requests   Final    BOTTLES DRAWN AEROBIC AND ANAEROBIC Blood Culture results may not be optimal due to an inadequate volume of blood received in culture bottles   Culture   Final    NO GROWTH 5 DAYS Performed at [facility], [address], [place],  [address]    Report Status 01/22/2020 FINAL  Final     Labs: BNP (last 3 results) No results for input(s): BNP in the last 8760 hours. Basic Metabolic Panel: Recent Labs  Lab 01/18/20 0623 01/19/20 0453 01/20/20 0610 01/21/20 0544 01/22/20 0508  NA 140 141 142 139 140  K 3.8 4.3 3.9 3.8 4.1  CL 103 102 101 100 101  CO2 28 30 30 30 30   GLUCOSE 118* 172* 151* 183* 161*  BUN 25* 27* 29* 29* 32*  CREATININE 0.83 0.79 0.70 0.81 0.82  CALCIUM 8.4* 8.5* 8.5* 8.4* 8.7*  MG 1.9 2.2 2.1 2.2 2.3  PHOS 3.1 2.9 3.0 2.8 3.0   Liver Function Tests: Recent Labs  Lab 01/18/20 0623 01/19/20 0453 01/20/20 0610 01/21/20 0544 01/22/20 0508  AST 28 27 29 31 26   ALT 30 29 31  43 43  ALKPHOS 40 41 45 41 45  BILITOT 1.0 0.7 1.0 0.7 0.7  PROT 6.6 6.7 6.7 6.5 6.4*  ALBUMIN 3.1* 3.2* 3.2* 3.1* 2.9*   No results for input(s):  LIPASE, AMYLASE in the last 168 hours. No results for input(s): AMMONIA in the last 168 hours. CBC: Recent Labs  Lab 01/18/20 0623 01/19/20 0453 01/20/20 0610 01/21/20 0544 01/22/20 0508  WBC 10.1 8.0 11.3* 11.8* 14.5*  NEUTROABS 8.1* 6.0 8.6* 9.2* 11.4*  HGB 14.8 14.6 15.4 15.0 15.7  HCT 42.5 44.1 44.7 43.4 45.8  MCV 86.7 90.0 86.1 85.9  85.9  PLT 196 249 316 331 369   Cardiac Enzymes: No results for input(s): CKTOTAL, CKMB, CKMBINDEX, TROPONINI in the last 168 hours. BNP: Invalid input(s): POCBNP CBG: No results for input(s): GLUCAP in the last 168 hours. D-Dimer No results for input(s): DDIMER in the last 72 hours. Hgb A1c No results for input(s): HGBA1C in the last 72 hours. Lipid Profile No results for input(s): CHOL, HDL, LDLCALC, TRIG, CHOLHDL, LDLDIRECT in the last 72 hours. Thyroid function studies No results for input(s): TSH, T4TOTAL, T3FREE, THYROIDAB in the last 72 hours.  Invalid input(s): FREET3 Anemia work up [facility]    01/21/20 0544 01/22/20 0508  FERRITIN 926* 842*   Urinalysis No results found for: COLORURINE, APPEARANCEUR, LABSPEC, PHURINE, GLUCOSEU, HGBUR, BILIRUBINUR, KETONESUR, PROTEINUR, UROBILINOGEN, NITRITE, LEUKOCYTESUR Sepsis Labs Invalid input(s): PROCALCITONIN,  WBC,  LACTICIDVEN Microbiology Recent Results (from the past 240 hour(s))  Blood Culture (routine x 2)     Status: None   Collection Time: 01/17/20 12:18 PM   Specimen: BLOOD  Result Value Ref Range Status   Specimen Description BLOOD LEFT ANTECUBITAL  Final   Special Requests   Final    BOTTLES DRAWN AEROBIC AND ANAEROBIC Blood Culture results may not be optimal due to an inadequate volume of blood received in culture bottles   Culture   Final    NO GROWTH 5 DAYS Performed at [facility], [address] [place], [place] [address]    Report Status 01/22/2020 FINAL  Final  Blood Culture (routine x 2)     Status: None   Collection Time: 01/17/20 12:30 PM   Specimen: BLOOD  Result Value Ref Range Status   Specimen Description BLOOD BLOOD RIGHT HAND  Final   Special Requests   Final    BOTTLES DRAWN AEROBIC AND ANAEROBIC Blood Culture results may not be optimal due to an inadequate volume of blood received in culture bottles   Culture   Final    NO GROWTH 5 DAYS Performed at [facility],  [address] [place], [place] [address]    Report Status 01/22/2020 FINAL  Final     Time coordinating discharge: Over 30 minutes  SIGNED:   Tresa MooreSudheer B Anderson Coppock, MD  [facility] 01/22/2020, 5:53 PM Pager   If 7PM-7AM, please contact night-coverage

## 2020-01-22 NOTE — Progress Notes (Signed)
PROGRESS NOTE    Danny Carson  FBP:[id number] DOB: 1956/08/12 DOA: 01/17/2020 PCP: Hortencia Pilar, MD   Brief Narrative:  HPI: Danny Carson is a 64 y.o. male with medical history significant for who was diagnosed with COVID 19 viral infection on January 13, 2020.  He had symptoms 3 to 4 days prior to his diagnosis and multiple family members have infection with Covid 19 virus.  He presents to the ER for evaluation of worsening shortness of breath and is now short of breath at rest.  He has a cough which is productive of clear phlegm. He also complains of  Fever/chills, pleuritic chest pain mostly left-sided as well as poor oral intake.  He denies having any nausea or vomiting, he denies having abdominal pain or any urinary symptoms. Twelve-lead EKG shows rapid atrial fibrillation. Chest x-ray shows patchy airspace disease throughout both lungs consistent with the provided history of COVID pneumonia.  4/21: Patient seen and examined.  Asks about going home.  Remains on 2 L supplemental oxygen.  Cardiology reevaluation appreciated.  Switch to metoprolol for rate control and Eliquis for VTE prophylaxis in setting of atrial fibrillation.   Assessment & Plan:   Active Problems:   Pneumonia due to COVID-19 virus   Atrial fibrillation with RVR (HCC)   COVID-19 pneumonia - remdesivir day 5/5, completed course - Decadron 6 mg IV daily (6/10) - weaned off to 2 liters but when nurse ambulated got very dyspneic/hypoxic on minimal exertion Plan: Continue supplemental oxygen, wean as tolerated Attempt additional diuretic, 40 mg Lasix IV x1 DME oxygen for home ordered   Atrial fibrillation rapid ventricular rate -  new onset Cardiology reevaluation appreciated Plan: DC Cardizem Start metoprolol 25 mg twice daily, uptitrate to achieve goal rate Ideally heart rate should not be above 100 even on ambulation Eliquis initiated Plan for discharge likely 01/23/2020 if heart rate controlled  patient ambulated to ambulate without dizziness or rapid heart rate      DVT prophylaxis: Eliquis Code Status: Full Family Communication:  Disposition Plan: Status is: Inpatient  Remains inpatient appropriate because:Inpatient level of care appropriate due to severity of illness   Dispo: The patient is from: Home              Anticipated d/c is to: Home              Anticipated d/c date is: 1 day              Patient currently is not medically stable to d/c.   Still tachycardic which worsens with minimal exertion.  Cardiac meds changed and will be further titrated.  Patient also dependent on 2 L oxygen, unable to wean.  And home DME oxygen ordered.      Consultants:   Cardiology-[facility]  Procedures:  none  Antimicrobials:   remdesivir (completed course)    Subjective: Patient seen and examined No complaints, feels well Remains hypoxic requiring 2 L supplemental oxygen Heart rate remains above 100 at rest  Objective: Vitals:   01/22/20 0001 01/22/20 0008 01/22/20 0300 01/22/20 0804  BP: (!) 135/59 136/61 136/70 (!) 146/97  Pulse: (!) 41 67 81 (!) 110  Resp: (!) 26 20 17  (!) 21  Temp: 97.8 F (36.6 C) 97.9 F (36.6 C) 98.7 F (37.1 C) (!) 97.5 F (36.4 C)  TempSrc: Oral  Oral Oral  SpO2: 94% 92% 94% 91%  Weight:      Height:  Intake/Output Summary (Last 24 hours) at 01/22/2020 1415 Last data filed at 01/22/2020 0930 Gross per 24 hour  Intake 240 ml  Output 925 ml  Net -685 ml   Filed Weights   01/17/20 1058  Weight: 104.3 kg    Examination:  General exam: Appears calm and comfortable  Respiratory system: Scattered rhonchi bilaterally, normal work of breathing Cardiovascular system: Tachycardic, irregular rhythm, no murmurs  gastrointestinal system: Abdomen is nondistended, soft and nontender. No organomegaly or masses felt. Normal bowel sounds heard. Central nervous system: Alert and oriented. No focal neurological  deficits. Extremities: Symmetric 5 x 5 power. Skin: No rashes, lesions or ulcers Psychiatry: Judgement and insight appear normal. Mood & affect appropriate.     Data Reviewed: I have personally reviewed following labs and imaging studies  CBC: Recent Labs  Lab 01/18/20 0623 01/19/20 0453 01/20/20 0610 01/21/20 0544 01/22/20 0508  WBC 10.1 8.0 11.3* 11.8* 14.5*  NEUTROABS 8.1* 6.0 8.6* 9.2* 11.4*  HGB 14.8 14.6 15.4 15.0 15.7  HCT 42.5 44.1 44.7 43.4 45.8  MCV 86.7 90.0 86.1 85.9 85.9  PLT 196 249 316 331 369   Basic Metabolic Panel: Recent Labs  Lab 01/18/20 0623 01/19/20 0453 01/20/20 0610 01/21/20 0544 01/22/20 0508  NA 140 141 142 139 140  K 3.8 4.3 3.9 3.8 4.1  CL 103 102 101 100 101  CO2 28 30 30 30 30   GLUCOSE 118* 172* 151* 183* 161*  BUN 25* 27* 29* 29* 32*  CREATININE 0.83 0.79 0.70 0.81 0.82  CALCIUM 8.4* 8.5* 8.5* 8.4* 8.7*  MG 1.9 2.2 2.1 2.2 2.3  PHOS 3.1 2.9 3.0 2.8 3.0   GFR: Estimated Creatinine Clearance: 115.2 mL/min (by C-G formula based on SCr of 0.82 mg/dL). Liver Function Tests: Recent Labs  Lab 01/18/20 0623 01/19/20 0453 01/20/20 0610 01/21/20 0544 01/22/20 0508  AST 28 27 29 31 26   ALT 30 29 31  43 43  ALKPHOS 40 41 45 41 45  BILITOT 1.0 0.7 1.0 0.7 0.7  PROT 6.6 6.7 6.7 6.5 6.4*  ALBUMIN 3.1* 3.2* 3.2* 3.1* 2.9*   No results for input(s): LIPASE, AMYLASE in the last 168 hours. No results for input(s): AMMONIA in the last 168 hours. Coagulation Profile: No results for input(s): INR, PROTIME in the last 168 hours. Cardiac Enzymes: No results for input(s): CKTOTAL, CKMB, CKMBINDEX, TROPONINI in the last 168 hours. BNP (last 3 results) No results for input(s): PROBNP in the last 8760 hours. HbA1C: No results for input(s): HGBA1C in the last 72 hours. CBG: No results for input(s): GLUCAP in the last 168 hours. Lipid Profile: No results for input(s): CHOL, HDL, LDLCALC, TRIG, CHOLHDL, LDLDIRECT in the last 72 hours. Thyroid  Function Tests: No results for input(s): TSH, T4TOTAL, FREET4, T3FREE, THYROIDAB in the last 72 hours. Anemia Panel: Recent Labs    01/21/20 0544 01/22/20 0508  FERRITIN 926* 842*   Sepsis Labs: Recent Labs  Lab 01/17/20 1156  PROCALCITON <0.10  LATICACIDVEN 1.6    Recent Results (from the past 240 hour(s))  Blood Culture (routine x 2)     Status: None   Collection Time: 01/17/20 12:18 PM   Specimen: BLOOD  Result Value Ref Range Status   Specimen Description BLOOD LEFT ANTECUBITAL  Final   Special Requests   Final    BOTTLES DRAWN AEROBIC AND ANAEROBIC Blood Culture results may not be optimal due to an inadequate volume of blood received in culture bottles   Culture   Final  NO GROWTH 5 DAYS Performed at [facility], [address] [place], [place] [address]    Report Status 01/22/2020 FINAL  Final  Blood Culture (routine x 2)     Status: None   Collection Time: 01/17/20 12:30 PM   Specimen: BLOOD  Result Value Ref Range Status   Specimen Description BLOOD BLOOD RIGHT HAND  Final   Special Requests   Final    BOTTLES DRAWN AEROBIC AND ANAEROBIC Blood Culture results may not be optimal due to an inadequate volume of blood received in culture bottles   Culture   Final    NO GROWTH 5 DAYS Performed at [facility], [address] [place], [place] [address]    Report Status 01/22/2020 FINAL  Final         Radiology Studies: No results found.      Scheduled Meds: . apixaban  5 mg Oral BID  . dexamethasone (DECADRON) injection  6 mg Intravenous Q24H  . metoprolol tartrate  25 mg Oral BID   Continuous Infusions:   LOS: 5 days    Time spent: 35 min    Tresa Moore, MD [facility] Pager [phone]  If 7PM-7AM, please contact night-coverage 01/22/2020, 2:15 PM

## 2020-01-22 NOTE — Progress Notes (Signed)
[facility] Cardiology    SUBJECTIVE: Patient still has some shortness of breath but is feeling somewhat better slight improvement still has irregular irregular heartbeat denies any chest pain still has dyspnea with modest exertion.  Fevers much improved hypoxemia is improved   Vitals:   01/22/20 0001 01/22/20 0008 01/22/20 0300 01/22/20 0804  BP: (!) 135/59 136/61 136/70 (!) 146/97  Pulse: (!) 41 67 81 (!) 110  Resp: (!) 26 20 17  (!) 21  Temp: 97.8 F (36.6 C) 97.9 F (36.6 C) 98.7 F (37.1 C) (!) 97.5 F (36.4 C)  TempSrc: Oral  Oral Oral  SpO2: 94% 92% 94% 91%  Weight:      Height:         Intake/Output Summary (Last 24 hours) at 01/22/2020 1511 Last data filed at 01/22/2020 1345 Gross per 24 hour  Intake 480 ml  Output 2025 ml  Net -1545 ml      PHYSICAL EXAM  General: Well developed, well nourished, in no acute distress HEENT:  Normocephalic and atramatic Neck:  No JVD.  Lungs: Diminished bilaterally to auscultation and percussion. Heart: Irregular irregular. Normal S1 and S2 without gallops or murmurs.  Abdomen: Bowel sounds are positive, abdomen soft and non-tender  Msk:  Back normal, normal gait. Normal strength and tone for age. Extremities: No clubbing, cyanosis or edema.   Neuro: Alert and oriented X 3. Psych:  Good affect, responds appropriately   LABS: Basic Metabolic Panel: Recent Labs    01/21/20 0544 01/22/20 0508  NA 139 140  K 3.8 4.1  CL 100 101  CO2 30 30  GLUCOSE 183* 161*  BUN 29* 32*  CREATININE 0.81 0.82  CALCIUM 8.4* 8.7*  MG 2.2 2.3  PHOS 2.8 3.0   Liver Function Tests: Recent Labs    01/21/20 0544 01/22/20 0508  AST 31 26  ALT 43 43  ALKPHOS 41 45  BILITOT 0.7 0.7  PROT 6.5 6.4*  ALBUMIN 3.1* 2.9*   No results for input(s): LIPASE, AMYLASE in the last 72 hours. CBC: Recent Labs    01/21/20 0544 01/22/20 0508  WBC 11.8* 14.5*  NEUTROABS 9.2* 11.4*  HGB 15.0 15.7  HCT 43.4 45.8  MCV 85.9 85.9  PLT 331 369    Cardiac Enzymes: No results for input(s): CKTOTAL, CKMB, CKMBINDEX, TROPONINI in the last 72 hours. BNP: Invalid input(s): POCBNP D-Dimer: No results for input(s): DDIMER in the last 72 hours. Hemoglobin A1C: No results for input(s): HGBA1C in the last 72 hours. Fasting Lipid Panel: No results for input(s): CHOL, HDL, LDLCALC, TRIG, CHOLHDL, LDLDIRECT in the last 72 hours. Thyroid Function Tests: No results for input(s): TSH, T4TOTAL, T3FREE, THYROIDAB in the last 72 hours.  Invalid input(s): FREET3 Anemia Panel: Recent Labs    01/22/20 0508  FERRITIN 842*    No results found.   Echo normal left ventricular function ejection fraction of 60%  TELEMETRY: Atrial fibrillation rate reasonably controlled under 100  ASSESSMENT AND PLAN:  Active Problems:   Pneumonia due to COVID-19 virus   Atrial fibrillation with RVR (HCC) Shortness of breath Borderline obesity History of hypertension Exercise intolerance Hypoxemia . Plan Agree with continued therapy for Covid and pneumonia Maintain rate control with diltiazem and anticoagulation with Lovenox Recommend switch to Eliquis prior to discharge and maintain for now Increase modest exercise with walking continue supplemental oxygen as necessary If the patient persists in atrial fibrillation we will have to consider cardioversion at a later date Recommend continue aggressive therapy for Covid  for now     Yolonda Kida, MD 01/22/2020 3:11 PM

## 2020-01-22 NOTE — Discharge Instructions (Signed)

## 2020-01-22 NOTE — Progress Notes (Signed)
[facility] Cardiology    SUBJECTIVE: Patient is improving from Covid pneumonia he is less dyspneic less short of breath he still has exercise incapacity and intolerance patient is maintained atrial fibrillation reasonable controlled rate except once he exercise and ambulates and exerts himself then his heart rate usually becomes relatively tachycardic at about 100.  He has been maintained on diltiazem denies any chest pain no blackout spells or syncope has been on an adequate anticoagulation for A. fib which is DVT prophylaxis Lovenox would recommend switching to beta-blocker and Eliquis.  Do not recommend cardioversion at this point yet   Vitals:   01/21/20 2032 01/22/20 0001 01/22/20 0008 01/22/20 0300  BP:  (!) 135/59 136/61 136/70  Pulse: 86 (!) 41 67 81  Resp: 17 (!) 26 20 17   Temp:  97.8 F (36.6 C) 97.9 F (36.6 C) 98.7 F (37.1 C)  TempSrc:  Oral  Oral  SpO2: 93% 94% 92% 94%  Weight:      Height:         Intake/Output Summary (Last 24 hours) at 01/22/2020 0818 Last data filed at 01/21/2020 2006 Gross per 24 hour  Intake --  Output 500 ml  Net -500 ml      PHYSICAL EXAM  General: Well developed, well nourished, in no acute distress HEENT:  Normocephalic and atramatic Neck:  No JVD.  Lungs: Clear bilaterally to auscultation and percussion. Heart: Irregular irregular. Normal S1 and S2 without gallops or murmurs.  Abdomen: Bowel sounds are positive, abdomen soft and non-tender  Msk:  Back normal, normal gait. Normal strength and tone for age. Extremities: No clubbing, cyanosis or edema.   Neuro: Alert and oriented X 3. Psych:  Good affect, responds appropriately   LABS: Basic Metabolic Panel: Recent Labs    01/21/20 0544 01/22/20 0508  NA 139 140  K 3.8 4.1  CL 100 101  CO2 30 30  GLUCOSE 183* 161*  BUN 29* 32*  CREATININE 0.81 0.82  CALCIUM 8.4* 8.7*  MG 2.2 2.3  PHOS 2.8 3.0   Liver Function Tests: Recent Labs    01/21/20 0544 01/22/20 0508  AST 31 26   ALT 43 43  ALKPHOS 41 45  BILITOT 0.7 0.7  PROT 6.5 6.4*  ALBUMIN 3.1* 2.9*   No results for input(s): LIPASE, AMYLASE in the last 72 hours. CBC: Recent Labs    01/21/20 0544 01/22/20 0508  WBC 11.8* 14.5*  NEUTROABS 9.2* 11.4*  HGB 15.0 15.7  HCT 43.4 45.8  MCV 85.9 85.9  PLT 331 369   Cardiac Enzymes: No results for input(s): CKTOTAL, CKMB, CKMBINDEX, TROPONINI in the last 72 hours. BNP: Invalid input(s): POCBNP D-Dimer: No results for input(s): DDIMER in the last 72 hours. Hemoglobin A1C: No results for input(s): HGBA1C in the last 72 hours. Fasting Lipid Panel: No results for input(s): CHOL, HDL, LDLCALC, TRIG, CHOLHDL, LDLDIRECT in the last 72 hours. Thyroid Function Tests: Recent Labs    01/19/20 1218  T3FREE 1.6*   Anemia Panel: Recent Labs    01/22/20 0508  FERRITIN 842*    No results found.   Echo reasonable overall left ventricular function ejection fraction of around 60%  TELEMETRY: Atrial fibrillation controlled response nonspecific ST-T wave changes  ASSESSMENT AND PLAN:  Active Problems:   Pneumonia due to COVID-19 virus   Atrial fibrillation with RVR (HCC) Tachycardia Shortness of breath Generalized fatigue Borderline obesity History of hypertension. Dyspnea on exertion Exercise intolerance . Plan Patient is somewhat improved We will discontinue  diltiazem to help treat tachycardia We will start metoprolol tartrate 25 twice a day and advance dose as needed to maintain a heart rate under 100 Discontinue Lovenox [facility] Eliquis 5 mg twice a day in anticipation of discharge soon Would recommend troponin for evaluation of possible Covid myocarditis Once discharged have the patient follow-up with cardiology 1 to 2 weeks once it safe from his isolation and quarantine Patient should obtain a pulse ox machine to keep up with his sats as well as his heart rate I recommend gradual increase in activity and exercise as tolerated Do not  recommend electrical cardioversion at this point or high-level antiarrhythmic The goal is to maintain heart rate at around 100 on minimal exertion    Alwyn Pea, MD 01/22/2020 8:18 AM

## 2020-02-04 ENCOUNTER — Other Ambulatory Visit: Payer: Self-pay | Admitting: Internal Medicine

## 2020-02-04 DIAGNOSIS — U071 COVID-19: Secondary | ICD-10-CM

## 2020-02-05 ENCOUNTER — Other Ambulatory Visit: Payer: Self-pay | Admitting: Rehabilitative and Restorative Service Providers"

## 2020-03-06 ENCOUNTER — Other Ambulatory Visit: Payer: Self-pay | Admitting: Internal Medicine

## 2020-05-05 ENCOUNTER — Other Ambulatory Visit: Admission: RE | Admit: 2020-05-05 | Payer: Managed Care, Other (non HMO) | Source: Ambulatory Visit

## 2020-05-06 ENCOUNTER — Other Ambulatory Visit: Payer: Self-pay | Admitting: Family Medicine

## 2020-05-06 ENCOUNTER — Other Ambulatory Visit: Payer: Self-pay

## 2020-05-06 ENCOUNTER — Ambulatory Visit
Admission: RE | Admit: 2020-05-06 | Discharge: 2020-05-06 | Disposition: A | Discharge status: Home / Self Care | Payer: Managed Care, Other (non HMO) | Source: Ambulatory Visit | Attending: Family Medicine | Admitting: Family Medicine

## 2020-05-06 DIAGNOSIS — R1032 Left lower quadrant pain: Secondary | ICD-10-CM | POA: Insufficient documentation

## 2020-05-06 DIAGNOSIS — R319 Hematuria, unspecified: Secondary | ICD-10-CM | POA: Insufficient documentation

## 2020-05-07 ENCOUNTER — Ambulatory Visit
Admission: RE | Admit: 2020-05-07 | Payer: Managed Care, Other (non HMO) | Source: Ambulatory Visit | Admitting: Internal Medicine

## 2020-05-07 ENCOUNTER — Encounter (HOSPITAL_COMMUNITY): Payer: Self-pay

## 2020-05-07 ENCOUNTER — Encounter: Admission: RE | Payer: Self-pay | Source: Ambulatory Visit

## 2020-05-07 SURGERY — CARDIOVERSION
Anesthesia: General

## 2020-05-07 MED ORDER — LIDOCAINE HCL (PF) 2 % IJ SOLN
INTRAMUSCULAR | Status: AC
  Filled 2020-05-07: qty 5

## 2020-05-07 MED ORDER — PROPOFOL 10 MG/ML IV BOLUS
INTRAVENOUS | Status: AC
  Filled 2020-05-07: qty 20

## 2020-05-08 ENCOUNTER — Other Ambulatory Visit: Payer: Self-pay | Admitting: Family Medicine

## 2020-05-08 DIAGNOSIS — R319 Hematuria, unspecified: Secondary | ICD-10-CM

## 2020-05-08 DIAGNOSIS — R1032 Left lower quadrant pain: Secondary | ICD-10-CM

## 2020-07-30 ENCOUNTER — Ambulatory Visit: Admit: 2020-07-30 | Payer: Managed Care, Other (non HMO) | Admitting: Internal Medicine

## 2020-07-30 SURGERY — CARDIOVERSION
Anesthesia: General

## 2020-09-29 ENCOUNTER — Other Ambulatory Visit
Admission: RE | Admit: 2020-09-29 | Discharge: 2020-09-29 | Disposition: A | Discharge status: Home / Self Care | Payer: PRIVATE HEALTH INSURANCE | Source: Ambulatory Visit | Attending: Internal Medicine | Admitting: Internal Medicine

## 2020-09-29 ENCOUNTER — Other Ambulatory Visit: Payer: Self-pay

## 2020-09-29 DIAGNOSIS — Z20822 Contact with and (suspected) exposure to covid-19: Secondary | ICD-10-CM | POA: Insufficient documentation

## 2020-09-29 DIAGNOSIS — Z01812 Encounter for preprocedural laboratory examination: Secondary | ICD-10-CM | POA: Diagnosis present

## 2020-09-30 LAB — SARS CORONAVIRUS 2 (TAT 6-24 HRS): SARS Coronavirus 2: NEGATIVE

## 2020-10-01 ENCOUNTER — Encounter
Admission: RE | Disposition: A | Discharge status: Home / Self Care | Payer: Self-pay | Source: Home / Self Care | Attending: Internal Medicine

## 2020-10-01 ENCOUNTER — Ambulatory Visit: Payer: PRIVATE HEALTH INSURANCE | Admitting: Certified Registered"

## 2020-10-01 ENCOUNTER — Other Ambulatory Visit: Payer: Self-pay | Admitting: Cardiology

## 2020-10-01 ENCOUNTER — Encounter: Payer: Self-pay | Admitting: Internal Medicine

## 2020-10-01 ENCOUNTER — Other Ambulatory Visit: Payer: Self-pay

## 2020-10-01 ENCOUNTER — Ambulatory Visit
Admission: RE | Admit: 2020-10-01 | Discharge: 2020-10-01 | Disposition: A | Discharge status: Home / Self Care | Payer: PRIVATE HEALTH INSURANCE | Attending: Internal Medicine | Admitting: Internal Medicine

## 2020-10-01 DIAGNOSIS — I480 Paroxysmal atrial fibrillation: Secondary | ICD-10-CM | POA: Insufficient documentation

## 2020-10-01 DIAGNOSIS — Z79899 Other long term (current) drug therapy: Secondary | ICD-10-CM | POA: Insufficient documentation

## 2020-10-01 DIAGNOSIS — I10 Essential (primary) hypertension: Secondary | ICD-10-CM | POA: Insufficient documentation

## 2020-10-01 DIAGNOSIS — E6609 Other obesity due to excess calories: Secondary | ICD-10-CM | POA: Insufficient documentation

## 2020-10-01 DIAGNOSIS — Z6831 Body mass index (BMI) 31.0-31.9, adult: Secondary | ICD-10-CM | POA: Insufficient documentation

## 2020-10-01 DIAGNOSIS — Z7901 Long term (current) use of anticoagulants: Secondary | ICD-10-CM | POA: Diagnosis not present

## 2020-10-01 DIAGNOSIS — Z8616 Personal history of COVID-19: Secondary | ICD-10-CM | POA: Diagnosis not present

## 2020-10-01 DIAGNOSIS — I4891 Unspecified atrial fibrillation: Secondary | ICD-10-CM

## 2020-10-01 DIAGNOSIS — E7800 Pure hypercholesterolemia, unspecified: Secondary | ICD-10-CM | POA: Diagnosis not present

## 2020-10-01 HISTORY — DX: Unspecified atrial fibrillation: I48.91

## 2020-10-01 HISTORY — PX: CARDIOVERSION: SHX1299

## 2020-10-01 SURGERY — CARDIOVERSION
Anesthesia: General

## 2020-10-01 MED ORDER — PROPOFOL 10 MG/ML IV BOLUS
INTRAVENOUS | Status: DC | PRN
  Administered 2020-10-01: 50 mg via INTRAVENOUS
  Administered 2020-10-01: 30 mg via INTRAVENOUS

## 2020-10-01 MED ORDER — HYDROCORTISONE 1 % EX CREA
1.0000 "application " | TOPICAL_CREAM | Freq: Three times a day (TID) | CUTANEOUS | Status: DC | PRN
  Filled 2020-10-01: qty 28

## 2020-10-01 MED ORDER — FENTANYL CITRATE (PF) 100 MCG/2ML IJ SOLN: 25.0000 ug | INTRAMUSCULAR | Status: DC | PRN

## 2020-10-01 MED ORDER — PROPOFOL 10 MG/ML IV BOLUS
INTRAVENOUS | Status: AC
  Filled 2020-10-01: qty 20

## 2020-10-01 MED ORDER — SODIUM CHLORIDE 0.9 % IV SOLN
INTRAVENOUS | Status: DC | PRN
Start: 2020-10-01 — End: 2020-10-01

## 2020-10-01 MED ORDER — ONDANSETRON HCL 4 MG/2ML IJ SOLN: 4.0000 mg | Freq: Once | INTRAMUSCULAR | Status: DC | PRN

## 2020-10-01 NOTE — Procedures (Signed)
Procedure:  Cardioversion  Indication: symptomatic afib  After informed consent, time out protocol and adequate sedation per dept of anesthesia, pt received a DC biphasic synchronized shock at 120J, 150J, and 200J with conversion to nsr. No immediate complications.

## 2020-10-01 NOTE — Anesthesia Preprocedure Evaluation (Signed)
Anesthesia Evaluation  Patient identified by MRN, date of birth, ID band Patient awake    Reviewed: Allergy & Precautions, H&P , NPO status , Patient's Chart, lab work & pertinent test results, reviewed documented beta blocker date and time   Airway Mallampati: II   Neck ROM: full    Dental  (+) Poor Dentition   Pulmonary pneumonia, resolved,    Pulmonary exam normal        Cardiovascular Exercise Tolerance: Poor negative cardio ROS  Atrial Fibrillation  Rhythm:regular Rate:Normal     Neuro/Psych negative neurological ROS  negative psych ROS   GI/Hepatic negative GI ROS, Neg liver ROS,   Endo/Other  negative endocrine ROS  Renal/GU negative Renal ROS  negative genitourinary   Musculoskeletal   Abdominal   Peds  Hematology negative hematology ROS (+)   Anesthesia Other Findings No past medical history on file. Past Surgical History: No date: CHOLECYSTECTOMY   Reproductive/Obstetrics negative OB ROS                             Anesthesia Physical Anesthesia Plan  ASA: IV  Anesthesia Plan: General   Post-op Pain Management:    Induction:   PONV Risk Score and Plan:   Airway Management Planned:   Additional Equipment:   Intra-op Plan:   Post-operative Plan:   Informed Consent: I have reviewed the patients History and Physical, chart, labs and discussed the procedure including the risks, benefits and alternatives for the proposed anesthesia with the patient or authorized representative who has indicated his/her understanding and acceptance.     Dental Advisory Given  Plan Discussed with: CRNA  Anesthesia Plan Comments:         Anesthesia Quick Evaluation

## 2020-10-01 NOTE — Transfer of Care (Signed)
Immediate Anesthesia Transfer of Care Note  Patient: Danny Carson  Procedure(s) Performed: CARDIOVERSION (N/A )  Patient Location: PACU and Cath Lab  Anesthesia Type:General  Level of Consciousness: drowsy  Airway & Oxygen Therapy: Patient Spontanous Breathing and Patient connected to nasal cannula oxygen  Post-op Assessment: Report given to RN  Post vital signs: stable  Last Vitals:  Vitals Value Taken Time  BP 152/87 10/01/20 0739  Temp    Pulse 70 10/01/20 0740  Resp 21 10/01/20 0740  SpO2 98 % 10/01/20 0740    Last Pain:  Vitals:   10/01/20 0724  TempSrc: Oral  PainSc: 0-No pain         Complications: No complications documented.

## 2020-10-01 NOTE — H&P (Signed)
Chief Complaint: Chief Complaint  Patient presents with  . Atrial Fibrillation  after Covid  Date of Service: 09/14/2020 Date of Birth: March 22, 1956 PCP: Ernesta Amble, MD  History of Present Illness: Mr. Wuertz is a 64 y.o.male patient who presents with persistent atrial fibrillation since his initial presentation with Covid back in July patient was hospitalized and had difficult to control A. fib during Covid pneumonia patient's placed anticoagulation with Eliquis but has since been transitioned to Xarelto states to been compliant with his anticoagulation has had some mild increased heart rate and tachycardia in the past but none recently and feels reasonably well now now here for consideration of possible cardioversion. Patient is agreeable to outpatient cardioversion and understands risk and benefits and has agreed to pursue it electrically.  Past Medical and Surgical History  Past Medical History Past Medical History:  Diagnosis Date  . Eczema, unspecified  . Essential hypertension with goal blood pressure less than 140/90 02/12/2015  . Hyperglycemia 03/22/2018  . Psoriasis 02/12/2015   Past Surgical History He has a past surgical history that includes Cholecystectomy.   Medications and Allergies  Current Medications  Current Outpatient Medications  Medication Sig Dispense Refill  . diltiazem (CARDIZEM CD) 120 MG XR capsule Take 1 capsule (120 mg total) by mouth once daily 60 capsule 5  . metoprolol tartrate (LOPRESSOR) 50 MG tablet Take 1 tablet (50 mg total) by mouth 2 (two) times daily 90 tablet 3  . rivaroxaban (XARELTO) 20 mg tablet Take 1 tablet (20 mg total) by mouth daily with dinner 30 tablet 11   No current facility-administered medications for this visit.   Allergies: Latex and Ace inhibitors  Social and Family History  Social History reports that he has never smoked. He has never used smokeless tobacco. He reports that he does not drink alcohol and does not  use drugs.  Family History Family History  Problem Relation Age of Onset  . High blood pressure (Hypertension) Mother  . Thyroid disease Mother  . No Known Problems Father  . No Known Problems Sister  . No Known Problems Brother  . No Known Problems Daughter  . No Known Problems Son  . Stroke Maternal Aunt  . No Known Problems Sister   Review of Systems   Review of Systems: The patient denies chest pain, shortness of breath, orthopnea, paroxysmal nocturnal dyspnea, pedal edema, palpitations, heart racing, presyncope, syncope. Review of 12 Systems is negative except as described above.  Physical Examination   Vitals:Pulse 75  Ht 182.9 cm (6')  Wt (!) 106.1 kg (234 lb)  SpO2 98%  BMI 31.74 kg/m  Ht:182.9 cm (6') Wt:(!) 106.1 kg (234 lb) AUQ:JFHL surface area is 2.32 meters squared. Body mass index is 31.74 kg/m.  HEENT: Pupils equally reactive to light and accomodation  Neck: Supple without thyromegaly, carotid pulses 2+ Lungs: clear to auscultation bilaterally; no wheezes, rales, rhonchi Heart: Irregular rate and rhythm. No gallops, murmurs or rub Abdomen: soft nontender, nondistended, with normal bowel sounds Extremities: no cyanosis, clubbing, or edema Peripheral Pulses: 2+ in all extremities, 2+ femoral pulses bilaterally  Assessment   64 y.o. male with  1. Atrial fibrillation with RVR (CMS-HCC)  2. Need for vaccination  3. Anticoagulation management encounter  4. Preop testing  5. Essential hypertension with goal blood pressure less than 140/90  6. Hypercholesterolemia  7. Pneumonia due to COVID-19 virus  8. DOE (dyspnea on exertion)  9. Snoring  10. Hyperglycemia  11. 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  DWAYNE Salome Arnt, MD  Pt seen and examined. No change from above.

## 2020-10-02 ENCOUNTER — Encounter: Payer: Self-pay | Admitting: Internal Medicine

## 2020-10-08 NOTE — Anesthesia Postprocedure Evaluation (Signed)
Anesthesia Post Note  Patient: Danny Carson  Procedure(s) Performed: CARDIOVERSION (N/A )  Patient location during evaluation: PACU Anesthesia Type: General Level of consciousness: awake and alert Pain management: pain level controlled Vital Signs Assessment: post-procedure vital signs reviewed and stable Respiratory status: spontaneous breathing, nonlabored ventilation, respiratory function stable and patient connected to nasal cannula oxygen Cardiovascular status: blood pressure returned to baseline and stable Postop Assessment: no apparent nausea or vomiting Anesthetic complications: no   No complications documented.   Last Vitals:  Vitals:   10/01/20 0745 10/01/20 0800  BP: (!) 140/96 (!) 149/98  Pulse: 70 67  Resp: (!) 22 (!) 21  Temp:    SpO2:  94%    Last Pain:  Vitals:   10/01/20 0745  TempSrc:   PainSc: 0-No pain                 Yevette Edwards

## 2020-10-21 ENCOUNTER — Other Ambulatory Visit: Payer: Self-pay | Admitting: Internal Medicine

## 2020-10-22 ENCOUNTER — Other Ambulatory Visit: Payer: Self-pay | Admitting: Student

## 2020-11-23 ENCOUNTER — Other Ambulatory Visit
Admission: RE | Admit: 2020-11-23 | Discharge: 2020-11-23 | Disposition: A | Discharge status: Home / Self Care | Payer: No Typology Code available for payment source | Source: Ambulatory Visit | Attending: Internal Medicine | Admitting: Internal Medicine

## 2020-11-23 DIAGNOSIS — Z5181 Encounter for therapeutic drug level monitoring: Secondary | ICD-10-CM | POA: Diagnosis present

## 2020-11-23 DIAGNOSIS — I4891 Unspecified atrial fibrillation: Secondary | ICD-10-CM | POA: Diagnosis present

## 2020-11-23 DIAGNOSIS — Z01818 Encounter for other preprocedural examination: Secondary | ICD-10-CM | POA: Diagnosis present

## 2020-11-23 DIAGNOSIS — Z7901 Long term (current) use of anticoagulants: Secondary | ICD-10-CM | POA: Diagnosis present

## 2020-11-23 LAB — PROTIME-INR
INR: 2.3 — ABNORMAL HIGH (ref 0.8–1.2)
Prothrombin Time: 24.8 seconds — ABNORMAL HIGH (ref 11.4–15.2)

## 2020-12-15 ENCOUNTER — Other Ambulatory Visit
Admission: RE | Admit: 2020-12-15 | Discharge: 2020-12-15 | Disposition: A | Discharge status: Home / Self Care | Payer: PRIVATE HEALTH INSURANCE | Source: Ambulatory Visit | Attending: Internal Medicine | Admitting: Internal Medicine

## 2020-12-15 ENCOUNTER — Other Ambulatory Visit: Payer: Self-pay

## 2020-12-15 DIAGNOSIS — Z20822 Contact with and (suspected) exposure to covid-19: Secondary | ICD-10-CM | POA: Diagnosis not present

## 2020-12-15 DIAGNOSIS — Z01812 Encounter for preprocedural laboratory examination: Secondary | ICD-10-CM | POA: Diagnosis not present

## 2020-12-15 LAB — SARS CORONAVIRUS 2 (TAT 6-24 HRS): SARS Coronavirus 2: NEGATIVE

## 2020-12-17 ENCOUNTER — Ambulatory Visit: Payer: PRIVATE HEALTH INSURANCE | Admitting: Anesthesiology

## 2020-12-17 ENCOUNTER — Encounter: Payer: Self-pay | Admitting: Internal Medicine

## 2020-12-17 ENCOUNTER — Encounter
Admission: RE | Disposition: A | Discharge status: Home / Self Care | Payer: Self-pay | Source: Ambulatory Visit | Attending: Internal Medicine

## 2020-12-17 ENCOUNTER — Ambulatory Visit
Admission: RE | Admit: 2020-12-17 | Discharge: 2020-12-17 | Disposition: A | Discharge status: Home / Self Care | Payer: PRIVATE HEALTH INSURANCE | Source: Ambulatory Visit | Attending: Internal Medicine | Admitting: Internal Medicine

## 2020-12-17 ENCOUNTER — Other Ambulatory Visit: Payer: Self-pay

## 2020-12-17 DIAGNOSIS — I517 Cardiomegaly: Secondary | ICD-10-CM | POA: Diagnosis not present

## 2020-12-17 DIAGNOSIS — Z91040 Latex allergy status: Secondary | ICD-10-CM | POA: Diagnosis not present

## 2020-12-17 DIAGNOSIS — I491 Atrial premature depolarization: Secondary | ICD-10-CM | POA: Diagnosis not present

## 2020-12-17 DIAGNOSIS — Z8249 Family history of ischemic heart disease and other diseases of the circulatory system: Secondary | ICD-10-CM | POA: Insufficient documentation

## 2020-12-17 DIAGNOSIS — E669 Obesity, unspecified: Secondary | ICD-10-CM | POA: Insufficient documentation

## 2020-12-17 DIAGNOSIS — Z888 Allergy status to other drugs, medicaments and biological substances status: Secondary | ICD-10-CM | POA: Insufficient documentation

## 2020-12-17 DIAGNOSIS — I4891 Unspecified atrial fibrillation: Secondary | ICD-10-CM | POA: Insufficient documentation

## 2020-12-17 DIAGNOSIS — Z7901 Long term (current) use of anticoagulants: Secondary | ICD-10-CM | POA: Insufficient documentation

## 2020-12-17 DIAGNOSIS — Z8616 Personal history of COVID-19: Secondary | ICD-10-CM | POA: Diagnosis not present

## 2020-12-17 DIAGNOSIS — Z9049 Acquired absence of other specified parts of digestive tract: Secondary | ICD-10-CM | POA: Insufficient documentation

## 2020-12-17 DIAGNOSIS — Z79899 Other long term (current) drug therapy: Secondary | ICD-10-CM | POA: Insufficient documentation

## 2020-12-17 DIAGNOSIS — Z6833 Body mass index (BMI) 33.0-33.9, adult: Secondary | ICD-10-CM | POA: Insufficient documentation

## 2020-12-17 DIAGNOSIS — E785 Hyperlipidemia, unspecified: Secondary | ICD-10-CM | POA: Insufficient documentation

## 2020-12-17 DIAGNOSIS — I10 Essential (primary) hypertension: Secondary | ICD-10-CM | POA: Diagnosis not present

## 2020-12-17 HISTORY — PX: CARDIOVERSION: SHX1299

## 2020-12-17 SURGERY — CARDIOVERSION
Anesthesia: General

## 2020-12-17 MED ORDER — SODIUM CHLORIDE 0.9 % IV SOLN: INTRAVENOUS | Status: DC | PRN

## 2020-12-17 MED ORDER — PROPOFOL 10 MG/ML IV BOLUS
INTRAVENOUS | Status: DC | PRN
  Administered 2020-12-17: 80 mg via INTRAVENOUS
  Administered 2020-12-17: 10 mg via INTRAVENOUS

## 2020-12-17 MED ORDER — PROPOFOL 10 MG/ML IV BOLUS
INTRAVENOUS | Status: AC
  Filled 2020-12-17: qty 20

## 2020-12-17 NOTE — Anesthesia Postprocedure Evaluation (Signed)
Anesthesia Post Note  Patient: Danny Carson  Procedure(s) Performed: CARDIOVERSION (N/A )  Patient location during evaluation: PACU Anesthesia Type: General Level of consciousness: awake and alert Pain management: pain level controlled Vital Signs Assessment: post-procedure vital signs reviewed and stable Respiratory status: spontaneous breathing, nonlabored ventilation and respiratory function stable Cardiovascular status: blood pressure returned to baseline and stable Postop Assessment: no apparent nausea or vomiting Anesthetic complications: no   No complications documented.   Last Vitals:  Vitals:   12/17/20 0815 12/17/20 0830  BP: (!) 141/90   Pulse: (!) 57   Resp: 16   Temp:    SpO2: 96% 96%    Last Pain:  Vitals:   12/17/20 0725  TempSrc: Oral  PainSc: 0-No pain                 Aurelio Brash Itai Barbian

## 2020-12-17 NOTE — Anesthesia Procedure Notes (Signed)
Performed by: Amauria Younts, CRNA Pre-anesthesia Checklist: Patient identified, Emergency Drugs available, Suction available and Patient being monitored Patient Re-evaluated:Patient Re-evaluated prior to induction Oxygen Delivery Method: Nasal cannula Induction Type: IV induction Dental Injury: Teeth and Oropharynx as per pre-operative assessment  Comments: Nasal cannula with etCO2 monitoring       

## 2020-12-17 NOTE — Transfer of Care (Signed)
Immediate Anesthesia Transfer of Care Note  Patient: Danny Carson  Procedure(s) Performed: Procedure(s): CARDIOVERSION (N/A)  Patient Location: PACU and Short Stay  Anesthesia Type:General  Level of Consciousness: awake, alert  and oriented  Airway & Oxygen Therapy: Patient Spontanous Breathing and Patient connected to nasal cannula oxygen  Post-op Assessment: Report given to RN and Post -op Vital signs reviewed and stable  Post vital signs: Reviewed and stable  Last Vitals:  Vitals:   12/17/20 0750 12/17/20 0751  BP: (!) 180/102   Pulse: (!) 57 (!) 59  Resp: 19 (!) 23  Temp:    SpO2: 97% 94%    Complications: No apparent anesthesia complications

## 2020-12-17 NOTE — CV Procedure (Signed)
Electrical Cardioversion Procedure Note   Procedure: Electrical Cardioversion Indications:  Atrial Fibrillation  Procedure Details Consent: Risks of procedure as well as the alternatives and risks of each were explained to the (patient/caregiver).  Consent for procedure obtained. Time Out: Verified patient identification, verified procedure, site/side was marked, verified correct patient position, special equipment/implants available, medications/allergies/relevent history reviewed, required imaging and test results available.  Performed  Patient placed on cardiac monitor, pulse oximetry, supplemental oxygen as necessary.  Sedation given: As per anesthesia appears to be propofol Pacer pads placed anterior and posterior chest.  Cardioverted 3 time(s).  Cardioverted at 200J.  Evaluation Findings: Post procedure EKG shows: NSR rate of 55 Complications: None Patient did tolerate procedure well.   Dorothyann Peng, MD 02/12/2015, 8:02 AM

## 2020-12-17 NOTE — Anesthesia Preprocedure Evaluation (Addendum)
Anesthesia Evaluation  Patient identified by MRN, date of birth, ID band Patient awake    Reviewed: Allergy & Precautions, H&P , NPO status , Patient's Chart, lab work & pertinent test results  History of Anesthesia Complications Negative for: history of anesthetic complications  Airway Mallampati: II  TM Distance: >3 FB     Dental  (+) Teeth Intact   Pulmonary sleep apnea (suspected) , pneumonia, resolved, neg COPD,    breath sounds clear to auscultation       Cardiovascular (-) angina(-) Past MI and (-) Cardiac Stents + dysrhythmias Atrial Fibrillation  Rhythm:irregular Rate:Normal     Neuro/Psych negative neurological ROS  negative psych ROS   GI/Hepatic negative GI ROS, Neg liver ROS,   Endo/Other  negative endocrine ROS  Renal/GU negative Renal ROS  negative genitourinary   Musculoskeletal   Abdominal   Peds  Hematology negative hematology ROS (+)   Anesthesia Other Findings Obese  Past Medical History: No date: Atrial fibrillation [facility])  Past Surgical History: 10/01/2020: CARDIOVERSION; N/A     Comment:  Procedure: CARDIOVERSION;  Surgeon: Alwyn Pea,               MD;  Location: [facility] ORS;  Service: Cardiovascular;                Laterality: N/A; No date: CHOLECYSTECTOMY  BMI    Body Mass Index: 31.87 kg/m      Reproductive/Obstetrics negative OB ROS                            Anesthesia Physical Anesthesia Plan  ASA: II  Anesthesia Plan: General   Post-op Pain Management:    Induction:   PONV Risk Score and Plan:   Airway Management Planned: Simple Face Mask  Additional Equipment:   Intra-op Plan:   Post-operative Plan:   Informed Consent: I have reviewed the patients History and Physical, chart, labs and discussed the procedure including the risks, benefits and alternatives for the proposed anesthesia with the patient or authorized representative  who has indicated his/her understanding and acceptance.     Dental Advisory Given  Plan Discussed with: Anesthesiologist, CRNA and Surgeon  Anesthesia Plan Comments:         Anesthesia Quick Evaluation

## 2020-12-18 ENCOUNTER — Encounter: Payer: Self-pay | Admitting: Internal Medicine

## 2021-01-20 ENCOUNTER — Other Ambulatory Visit: Payer: Self-pay

## 2021-01-20 MED FILL — Metoprolol Tartrate Tab 50 MG: ORAL | 30 days supply | Qty: 60 | Fill #0 | Status: AC

## 2021-01-21 ENCOUNTER — Other Ambulatory Visit: Payer: Self-pay

## 2021-01-25 ENCOUNTER — Other Ambulatory Visit: Payer: Self-pay

## 2021-01-25 MED FILL — Rivaroxaban Tab 20 MG: ORAL | 30 days supply | Qty: 30 | Fill #0 | Status: AC

## 2021-02-23 ENCOUNTER — Other Ambulatory Visit: Payer: Self-pay

## 2021-02-23 MED FILL — Metoprolol Tartrate Tab 50 MG: ORAL | 30 days supply | Qty: 60 | Fill #1 | Status: AC

## 2021-02-23 MED FILL — Rivaroxaban Tab 20 MG: ORAL | 30 days supply | Qty: 30 | Fill #1 | Status: AC

## 2021-02-24 ENCOUNTER — Other Ambulatory Visit: Payer: Self-pay

## 2021-02-24 MED ORDER — TRIAMCINOLONE ACETONIDE 0.1 % EX CREA
TOPICAL_CREAM | Freq: Two times a day (BID) | CUTANEOUS | 2 refills | Status: AC
  Filled 2021-02-24: qty 45, 10d supply, fill #0

## 2021-02-24 MED ORDER — TRIAMCINOLONE ACETONIDE 0.1 % EX CREA
TOPICAL_CREAM | Freq: Two times a day (BID) | CUTANEOUS | 0 refills | Status: DC
  Filled 2021-02-24: qty 30, 10d supply, fill #0

## 2021-03-25 ENCOUNTER — Other Ambulatory Visit: Payer: Self-pay

## 2021-03-25 IMAGING — DX DG CHEST 1V PORT
1 series · 1 of 1 positions shown · non-contrast
Comparison: Concurrently performed CT angiogram chest

CLINICAL DATA: COVID pneumonia. Additional provided: Shortness of
breath for 1 week, diagnosed with COVID 01/13/2020, fever

EXAM:
PORTABLE CHEST 1 VIEW

[chest ap]
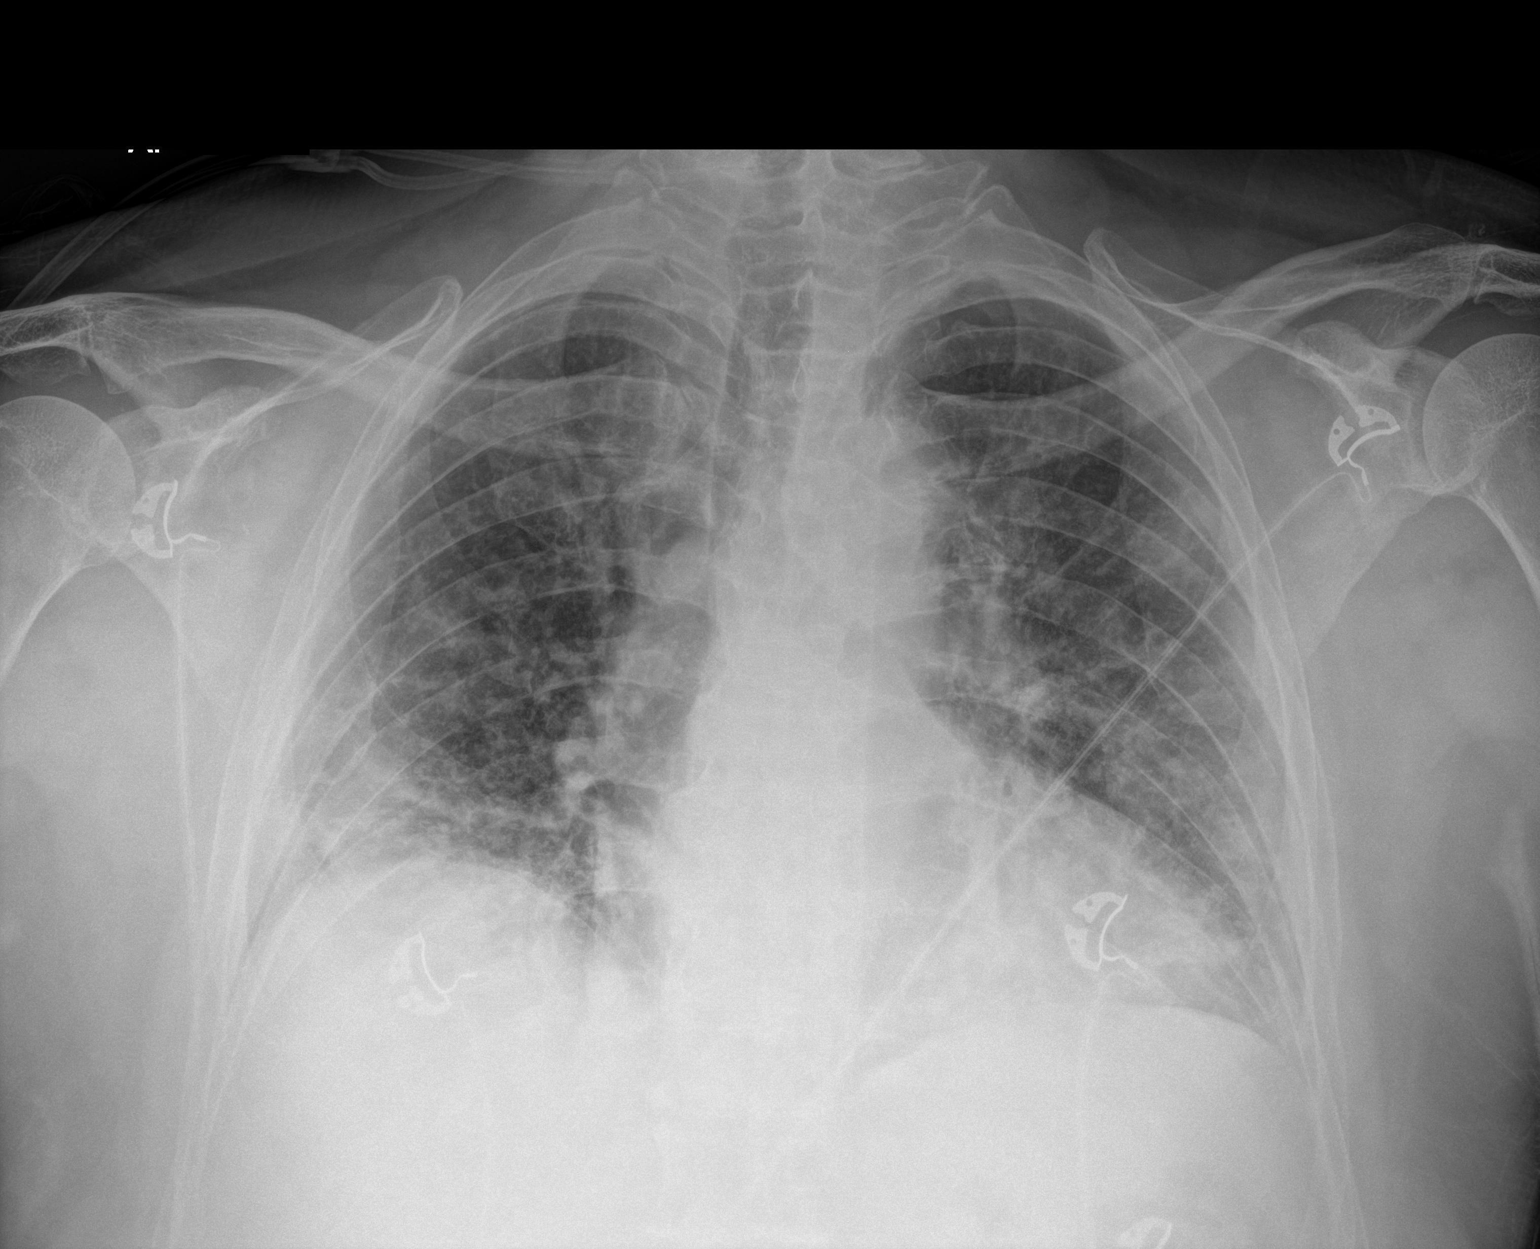

[1 of 1 positions shown; findings below may reference images not displayed]

FINDINGS: Heart size within normal limits. Patchy airspace disease throughout
both lungs consistent with provided history of COVID pneumonia.
There is no evidence of pleural effusion or pneumothorax. No acute
bony abnormality.
IMPRESSION: Patchy airspace disease throughout both lungs consistent with the
provided history of COVID pneumonia.

## 2021-03-26 ENCOUNTER — Other Ambulatory Visit: Payer: Self-pay

## 2021-03-26 MED ORDER — XARELTO 20 MG PO TABS
ORAL_TABLET | ORAL | 11 refills | Status: DC
  Filled 2021-03-26 – 2021-10-13 (×3): qty 30, 30d supply, fill #0
  Filled 2021-11-08: qty 30, 30d supply, fill #1
  Filled 2021-12-08: qty 30, 30d supply, fill #2
  Filled 2022-01-12: qty 30, 30d supply, fill #3
  Filled 2022-02-13: qty 30, 30d supply, fill #4
  Filled 2022-03-14: qty 30, 30d supply, fill #5

## 2021-03-26 MED ORDER — RIVAROXABAN 20 MG PO TABS
ORAL_TABLET | ORAL | 11 refills | Status: DC
  Filled 2021-03-26: qty 30, 30d supply, fill #0
  Filled 2021-04-26: qty 30, 30d supply, fill #1
  Filled 2021-05-23: qty 30, 30d supply, fill #2

## 2021-03-31 ENCOUNTER — Other Ambulatory Visit: Payer: Self-pay

## 2021-03-31 MED FILL — Metoprolol Tartrate Tab 50 MG: ORAL | 30 days supply | Qty: 60 | Fill #2 | Status: AC

## 2021-04-12 ENCOUNTER — Other Ambulatory Visit: Payer: Self-pay

## 2021-04-12 MED ORDER — LOSARTAN POTASSIUM 25 MG PO TABS
25.0000 mg | ORAL_TABLET | Freq: Every day | ORAL | 3 refills | Status: DC
  Filled 2021-04-12 – 2021-04-13 (×2): qty 90, 90d supply, fill #0

## 2021-04-13 ENCOUNTER — Other Ambulatory Visit: Payer: Self-pay

## 2021-04-26 ENCOUNTER — Other Ambulatory Visit: Payer: Self-pay

## 2021-04-26 MED FILL — Metoprolol Tartrate Tab 50 MG: ORAL | 30 days supply | Qty: 60 | Fill #3 | Status: AC

## 2021-04-27 ENCOUNTER — Other Ambulatory Visit: Payer: Self-pay

## 2021-04-28 ENCOUNTER — Other Ambulatory Visit: Payer: Self-pay

## 2021-04-28 DIAGNOSIS — I4892 Unspecified atrial flutter: Secondary | ICD-10-CM | POA: Diagnosis not present

## 2021-04-28 DIAGNOSIS — R001 Bradycardia, unspecified: Secondary | ICD-10-CM | POA: Diagnosis not present

## 2021-04-28 DIAGNOSIS — E7800 Pure hypercholesterolemia, unspecified: Secondary | ICD-10-CM | POA: Diagnosis not present

## 2021-04-28 DIAGNOSIS — I4819 Other persistent atrial fibrillation: Secondary | ICD-10-CM | POA: Diagnosis not present

## 2021-04-28 DIAGNOSIS — I10 Essential (primary) hypertension: Secondary | ICD-10-CM | POA: Diagnosis not present

## 2021-05-11 DIAGNOSIS — N529 Male erectile dysfunction, unspecified: Secondary | ICD-10-CM | POA: Diagnosis not present

## 2021-05-11 DIAGNOSIS — I10 Essential (primary) hypertension: Secondary | ICD-10-CM | POA: Diagnosis not present

## 2021-05-19 DIAGNOSIS — N529 Male erectile dysfunction, unspecified: Secondary | ICD-10-CM | POA: Diagnosis not present

## 2021-05-24 ENCOUNTER — Other Ambulatory Visit: Payer: Self-pay

## 2021-06-03 ENCOUNTER — Other Ambulatory Visit: Payer: Self-pay

## 2021-06-03 MED ORDER — METOPROLOL TARTRATE 50 MG PO TABS
ORAL_TABLET | Freq: Two times a day (BID) | ORAL | 11 refills | Status: DC
  Filled 2021-06-03: qty 60, 30d supply, fill #0
  Filled 2021-06-10: qty 60, 30d supply, fill #1
  Filled 2021-07-06: qty 60, 30d supply, fill #2
  Filled 2021-08-09: qty 60, 30d supply, fill #3
  Filled 2021-09-20: qty 60, 30d supply, fill #4
  Filled 2021-10-28: qty 60, 30d supply, fill #5
  Filled 2021-12-08: qty 60, 30d supply, fill #6
  Filled 2022-01-12: qty 60, 30d supply, fill #7
  Filled 2022-02-15: qty 60, 30d supply, fill #8
  Filled 2022-03-24: qty 60, 30d supply, fill #9

## 2021-06-10 ENCOUNTER — Other Ambulatory Visit: Payer: Self-pay

## 2021-06-10 MED ORDER — XARELTO 20 MG PO TABS
ORAL_TABLET | ORAL | 3 refills | Status: DC
  Filled 2021-06-11: qty 30, 30d supply, fill #0
  Filled 2021-07-06: qty 30, 30d supply, fill #1
  Filled 2021-08-09: qty 30, 30d supply, fill #2
  Filled 2021-09-08: qty 30, 30d supply, fill #3

## 2021-06-10 MED ORDER — METOPROLOL TARTRATE 50 MG PO TABS: 50.0000 mg | ORAL_TABLET | Freq: Two times a day (BID) | ORAL | 3 refills | Status: AC

## 2021-06-11 ENCOUNTER — Other Ambulatory Visit: Payer: Self-pay

## 2021-06-21 DIAGNOSIS — M62830 Muscle spasm of back: Secondary | ICD-10-CM | POA: Diagnosis not present

## 2021-06-21 DIAGNOSIS — M5412 Radiculopathy, cervical region: Secondary | ICD-10-CM | POA: Diagnosis not present

## 2021-06-21 DIAGNOSIS — M5033 Other cervical disc degeneration, cervicothoracic region: Secondary | ICD-10-CM | POA: Diagnosis not present

## 2021-06-21 DIAGNOSIS — M9901 Segmental and somatic dysfunction of cervical region: Secondary | ICD-10-CM | POA: Diagnosis not present

## 2021-06-22 DIAGNOSIS — M5412 Radiculopathy, cervical region: Secondary | ICD-10-CM | POA: Diagnosis not present

## 2021-06-22 DIAGNOSIS — M62830 Muscle spasm of back: Secondary | ICD-10-CM | POA: Diagnosis not present

## 2021-06-22 DIAGNOSIS — M5033 Other cervical disc degeneration, cervicothoracic region: Secondary | ICD-10-CM | POA: Diagnosis not present

## 2021-06-22 DIAGNOSIS — M9901 Segmental and somatic dysfunction of cervical region: Secondary | ICD-10-CM | POA: Diagnosis not present

## 2021-06-28 DIAGNOSIS — M5033 Other cervical disc degeneration, cervicothoracic region: Secondary | ICD-10-CM | POA: Diagnosis not present

## 2021-06-28 DIAGNOSIS — M5412 Radiculopathy, cervical region: Secondary | ICD-10-CM | POA: Diagnosis not present

## 2021-06-28 DIAGNOSIS — M9901 Segmental and somatic dysfunction of cervical region: Secondary | ICD-10-CM | POA: Diagnosis not present

## 2021-06-28 DIAGNOSIS — M62830 Muscle spasm of back: Secondary | ICD-10-CM | POA: Diagnosis not present

## 2021-07-06 ENCOUNTER — Other Ambulatory Visit: Payer: Self-pay

## 2021-07-06 DIAGNOSIS — M5412 Radiculopathy, cervical region: Secondary | ICD-10-CM | POA: Diagnosis not present

## 2021-07-06 DIAGNOSIS — M9901 Segmental and somatic dysfunction of cervical region: Secondary | ICD-10-CM | POA: Diagnosis not present

## 2021-07-06 DIAGNOSIS — M5033 Other cervical disc degeneration, cervicothoracic region: Secondary | ICD-10-CM | POA: Diagnosis not present

## 2021-07-06 DIAGNOSIS — M62830 Muscle spasm of back: Secondary | ICD-10-CM | POA: Diagnosis not present

## 2021-07-13 IMAGING — CT CT RENAL STONE PROTOCOL
2 of 4 series · 14 of 46 positions shown, 16 images · non-contrast
Comparison: CT a chest 01/17/2020, CT abdomen pelvis [DATE]
(report only, images unavailable), MR lumbar spine 03/09/2019

CLINICAL DATA: Hematuria and left lower quadrant pain for 2 weeks,
history of cholecystitis

EXAM:
CT ABDOMEN AND PELVIS WITHOUT CONTRAST
TECHNIQUE: Multidetector CT imaging of the abdomen and pelvis was performed
following the standard protocol without IV contrast.

[Series 2: stone full standard · axial · 0.78mm/px · z∈[-542,-52]mm · 11 of 113 slices shown, 13 images]
[im 10/113  soft-tissue]
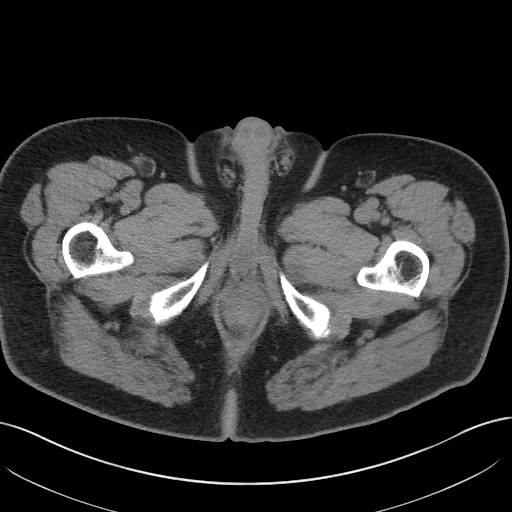
[im 10/113  bone]
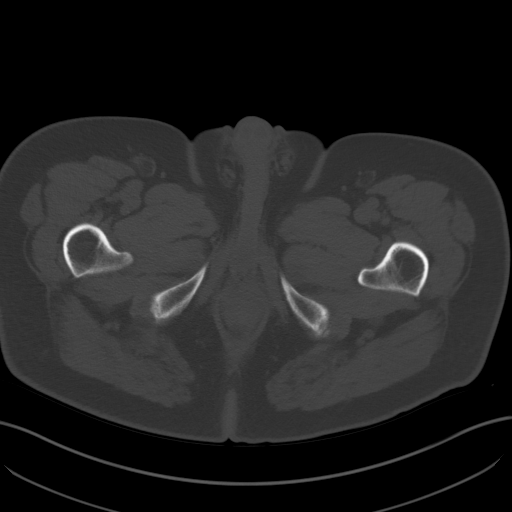
[im 20/113  soft-tissue]
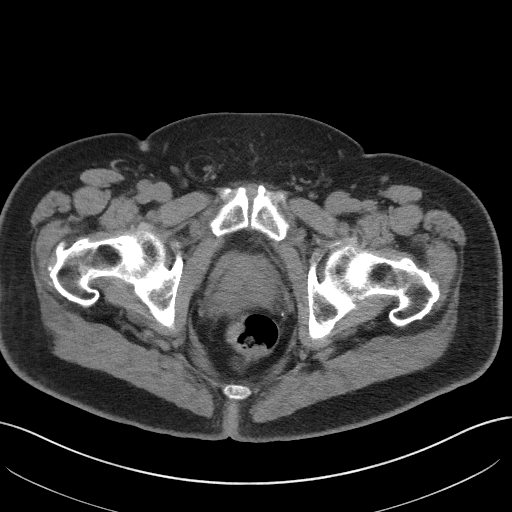
[im 30/113  soft-tissue]
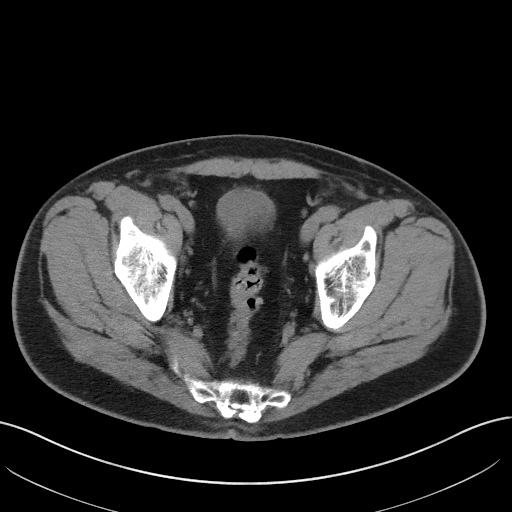
[im 39/113  soft-tissue]
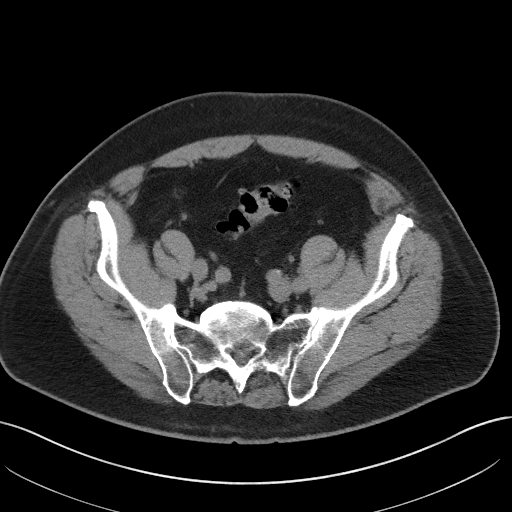
[im 49/113  soft-tissue]
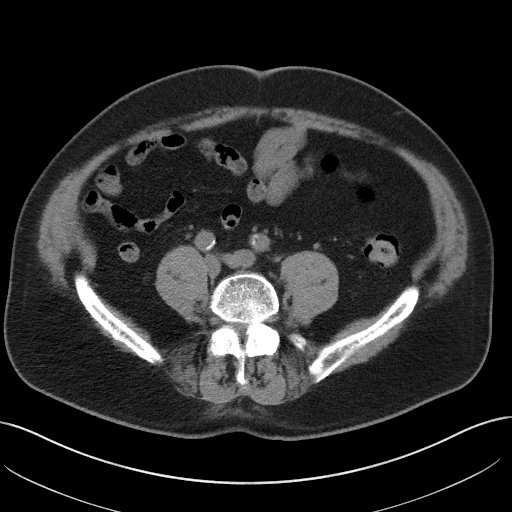
[im 59/113  soft-tissue]
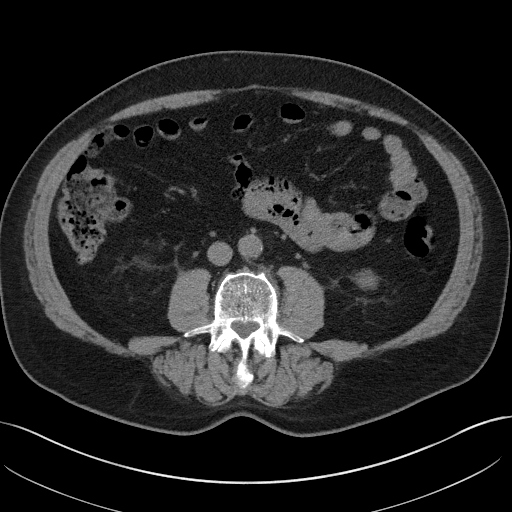
[im 69/113  soft-tissue]
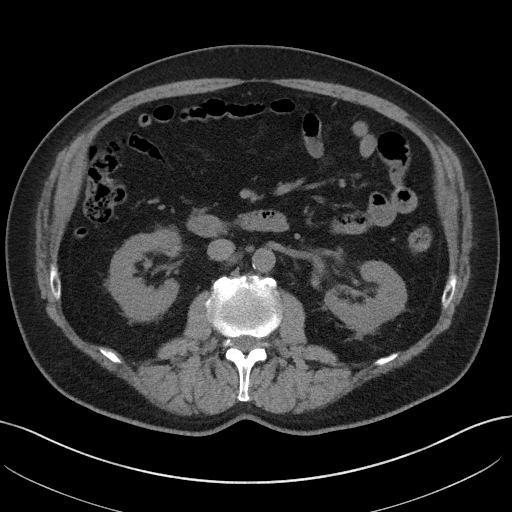
[im 78/113  soft-tissue]
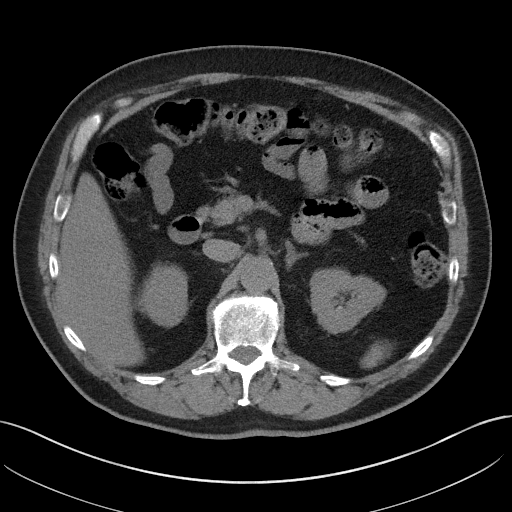
[im 88/113  soft-tissue]
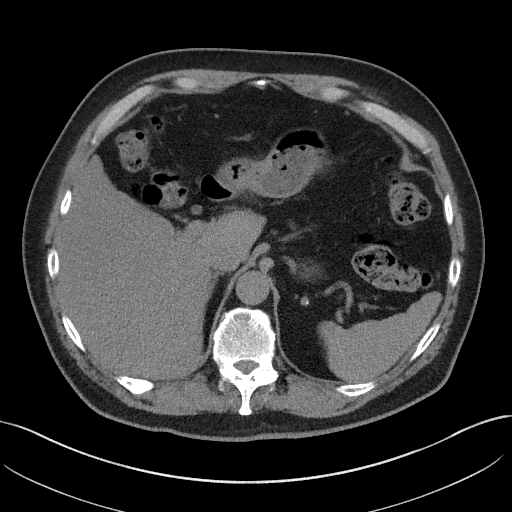
[im 88/113  bone]
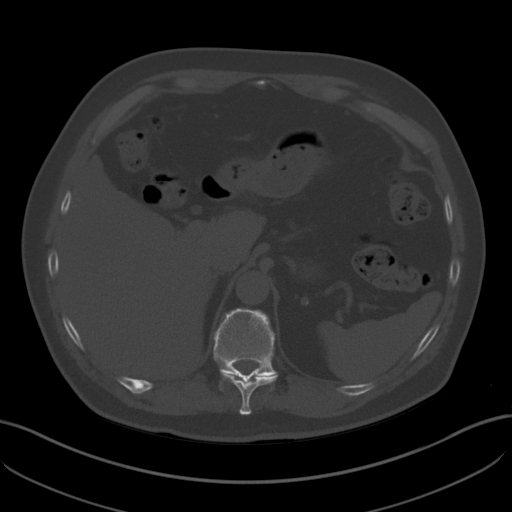
[im 98/113  soft-tissue]
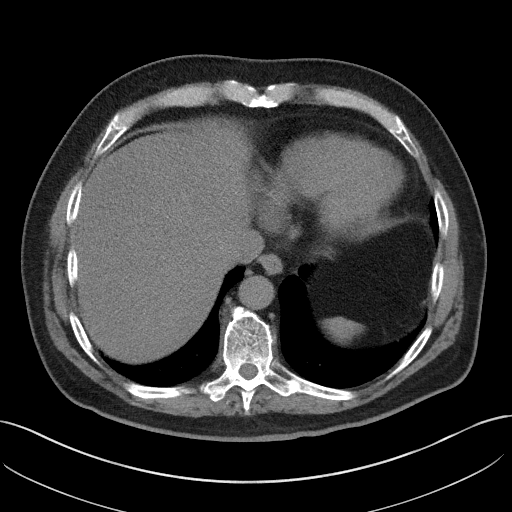
[im 108/113  soft-tissue]
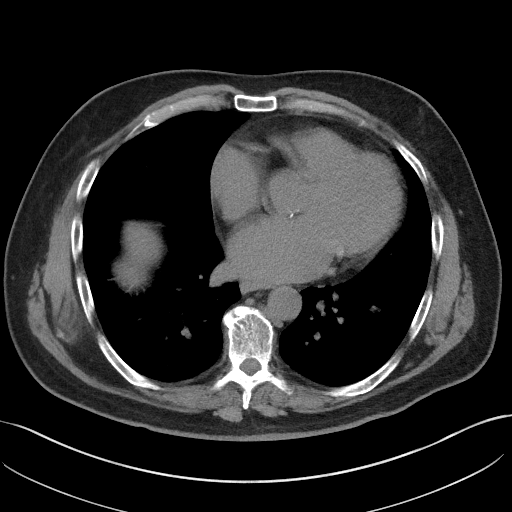

[Series 5: coronal · coronal · 0.90mm/px · 3 of 151 slices shown]
[im 51/151  soft-tissue]
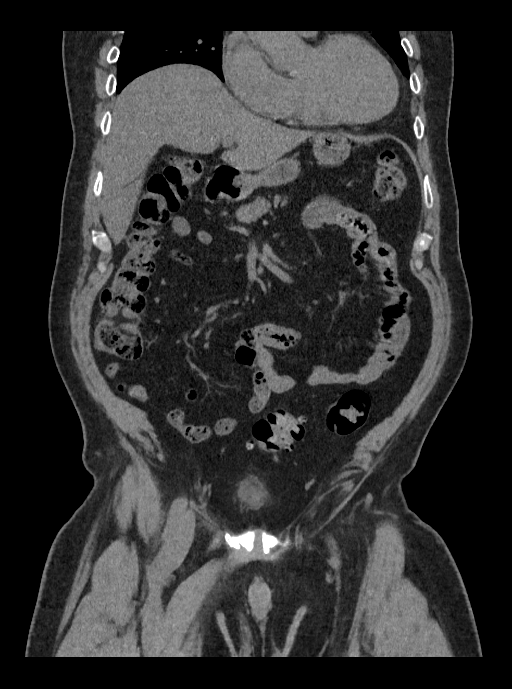
[im 67/151  soft-tissue]
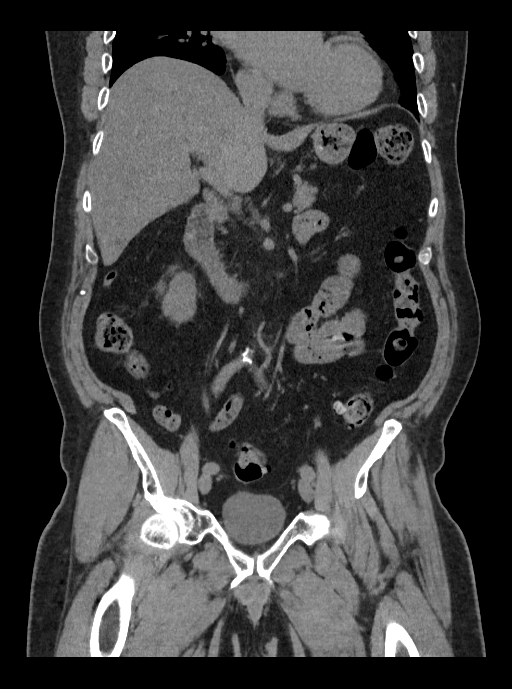
[im 84/151  soft-tissue]
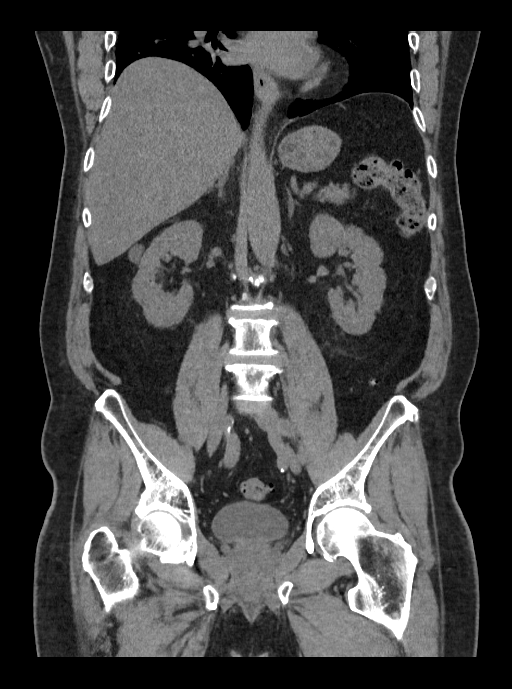

[14 of 46 positions shown; findings below may reference images not displayed]

FINDINGS: Lower chest: Atelectatic changes otherwise clear lung bases. Cardiac
size at the upper limits of normal. Calcifications upon the aortic
leaflets. Minimal calcific plaque in the coronaries. No pericardial
effusion. Mild bilateral gynecomastia.

Hepatobiliary: No visible concerning liver lesion on this unenhanced
CT. Smooth surface contour. Normal liver attenuation. Post
cholecystectomy. No biliary ductal dilatation or visible intraductal
gallstones.

Pancreas: Unremarkable. No pancreatic ductal dilatation or
surrounding inflammatory changes.

Spleen: Normal in size. No concerning splenic lesions.

Adrenals/Urinary Tract: Normal adrenal glands. Kidneys normally
positioned and symmetric in size. Partially exophytic 2.1 cm fluid
attenuation cyst arising from the upper pole right kidney (2/41). No
visible or contour deforming concerning renal lesions. Few punctate
nonobstructing calculi present in the lower pole right kidney. No
obstructive urolithiasis or hydronephrosis. Slightly asymmetric mild
left periureteral stranding. Urinary bladder is unremarkable aside
from slight indentation of the bladder base by a borderline enlarged
prostate.

Stomach/Bowel: Distal esophagus, stomach and duodenal sweep are
unremarkable. No small bowel wall thickening or dilatation.
Fecalization of the distal small bowel contents without mechanical
obstruction. A normal appendix is visualized. No colonic dilatation
or wall thickening. Scattered colonic diverticula without focal
inflammation to suggest diverticulitis.

Vascular/Lymphatic: Atherosclerotic calcifications within the
abdominal aorta and branch vessels. No aneurysm or ectasia. No
enlarged abdominopelvic lymph nodes.

Reproductive: Mild prostatomegaly. No concerning focal prostate
abnormality or seminal vesicle abnormality.

Other: No abdominopelvic free fluid or free gas. No bowel containing
hernias. Small bilateral fat containing inguinal hernias.

Musculoskeletal: The osseous structures appear diffusely
demineralized which may limit detection of small or nondisplaced
fractures. No acute osseous abnormality or suspicious osseous
lesion. Multilevel degenerative changes are present in the imaged
portions of the spine. Multilevel flowing anterior osteophytosis of
the lower thoracic spine, compatible with features of diffuse
idiopathic skeletal hyperostosis (DISH). Congenitally shortened
appearance of the pedicles in the lower lumbar levels likely
contributing to multilevel canal and foraminal narrowing in the
lumbar spine better detailed on prior MRI.
IMPRESSION: 1. No obstructive urolithiasis or hydronephrosis. Punctate
nonobstructing right nephrolithiasis. Mild bilateral nonspecific
perinephric stranding but with faint asymmetric left periureteral
stranding as well. Could reflect recently passed calculus or
ascending tract infection.
2. Mild prostatomegaly.
3. Fecalization of the distal small bowel contents without
mechanical obstruction. Findings could reflect slowed intestinal
transit/constipation.
4. Colonic diverticulosis without evidence of diverticulitis.
5. Aortic Atherosclerosis (UR42G-WQT.T).

## 2021-08-09 ENCOUNTER — Other Ambulatory Visit: Payer: Self-pay

## 2021-08-23 DIAGNOSIS — I4892 Unspecified atrial flutter: Secondary | ICD-10-CM | POA: Diagnosis not present

## 2021-08-23 DIAGNOSIS — I4819 Other persistent atrial fibrillation: Secondary | ICD-10-CM | POA: Diagnosis not present

## 2021-08-23 DIAGNOSIS — R001 Bradycardia, unspecified: Secondary | ICD-10-CM | POA: Diagnosis not present

## 2021-08-23 DIAGNOSIS — I4891 Unspecified atrial fibrillation: Secondary | ICD-10-CM | POA: Diagnosis not present

## 2021-09-08 ENCOUNTER — Other Ambulatory Visit: Payer: Self-pay

## 2021-09-20 ENCOUNTER — Other Ambulatory Visit: Payer: Self-pay

## 2021-10-03 HISTORY — PX: ATRIAL FIBRILLATION ABLATION: SHX5732

## 2021-10-11 ENCOUNTER — Other Ambulatory Visit: Payer: Self-pay

## 2021-10-12 ENCOUNTER — Other Ambulatory Visit: Payer: Self-pay

## 2021-10-13 ENCOUNTER — Other Ambulatory Visit: Payer: Self-pay

## 2021-10-14 ENCOUNTER — Other Ambulatory Visit: Payer: Self-pay

## 2021-10-14 MED ORDER — RIVAROXABAN 20 MG PO TABS
ORAL_TABLET | ORAL | 3 refills | Status: DC
  Filled 2021-10-14: qty 30, 30d supply, fill #0

## 2021-10-28 ENCOUNTER — Other Ambulatory Visit: Payer: Self-pay

## 2021-11-01 ENCOUNTER — Other Ambulatory Visit: Payer: Self-pay

## 2021-11-01 DIAGNOSIS — Z7901 Long term (current) use of anticoagulants: Secondary | ICD-10-CM | POA: Diagnosis not present

## 2021-11-01 DIAGNOSIS — Z1211 Encounter for screening for malignant neoplasm of colon: Secondary | ICD-10-CM | POA: Diagnosis not present

## 2021-11-01 DIAGNOSIS — I4891 Unspecified atrial fibrillation: Secondary | ICD-10-CM | POA: Diagnosis not present

## 2021-11-01 MED ORDER — PEG 3350-KCL-NA BICARB-NACL 420 G PO SOLR
ORAL | 0 refills | Status: AC
  Filled 2021-11-01: qty 4000, 1d supply, fill #0

## 2021-11-08 ENCOUNTER — Other Ambulatory Visit: Payer: Self-pay

## 2021-11-23 DIAGNOSIS — I110 Hypertensive heart disease with heart failure: Secondary | ICD-10-CM | POA: Diagnosis not present

## 2021-11-23 DIAGNOSIS — Z23 Encounter for immunization: Secondary | ICD-10-CM | POA: Diagnosis not present

## 2021-11-23 DIAGNOSIS — Z7901 Long term (current) use of anticoagulants: Secondary | ICD-10-CM | POA: Diagnosis not present

## 2021-11-23 DIAGNOSIS — I5020 Unspecified systolic (congestive) heart failure: Secondary | ICD-10-CM | POA: Diagnosis not present

## 2021-11-23 DIAGNOSIS — R5383 Other fatigue: Secondary | ICD-10-CM | POA: Diagnosis not present

## 2021-11-23 DIAGNOSIS — I4891 Unspecified atrial fibrillation: Secondary | ICD-10-CM | POA: Diagnosis not present

## 2021-11-23 DIAGNOSIS — I4819 Other persistent atrial fibrillation: Secondary | ICD-10-CM | POA: Diagnosis not present

## 2021-11-23 DIAGNOSIS — R9431 Abnormal electrocardiogram [ECG] [EKG]: Secondary | ICD-10-CM | POA: Diagnosis not present

## 2021-11-23 DIAGNOSIS — Z79899 Other long term (current) drug therapy: Secondary | ICD-10-CM | POA: Diagnosis not present

## 2021-11-23 DIAGNOSIS — E669 Obesity, unspecified: Secondary | ICD-10-CM | POA: Diagnosis not present

## 2021-12-08 ENCOUNTER — Other Ambulatory Visit: Payer: Self-pay

## 2021-12-08 DIAGNOSIS — R0602 Shortness of breath: Secondary | ICD-10-CM | POA: Diagnosis not present

## 2021-12-08 DIAGNOSIS — R5383 Other fatigue: Secondary | ICD-10-CM | POA: Diagnosis not present

## 2021-12-22 DIAGNOSIS — Z01818 Encounter for other preprocedural examination: Secondary | ICD-10-CM | POA: Diagnosis not present

## 2021-12-22 DIAGNOSIS — Z7901 Long term (current) use of anticoagulants: Secondary | ICD-10-CM | POA: Diagnosis not present

## 2021-12-22 DIAGNOSIS — I10 Essential (primary) hypertension: Secondary | ICD-10-CM | POA: Diagnosis not present

## 2021-12-22 DIAGNOSIS — I4891 Unspecified atrial fibrillation: Secondary | ICD-10-CM | POA: Diagnosis not present

## 2022-01-03 DIAGNOSIS — Z01810 Encounter for preprocedural cardiovascular examination: Secondary | ICD-10-CM | POA: Diagnosis not present

## 2022-01-03 DIAGNOSIS — Z7901 Long term (current) use of anticoagulants: Secondary | ICD-10-CM | POA: Diagnosis not present

## 2022-01-03 DIAGNOSIS — I4891 Unspecified atrial fibrillation: Secondary | ICD-10-CM | POA: Diagnosis not present

## 2022-01-03 DIAGNOSIS — I10 Essential (primary) hypertension: Secondary | ICD-10-CM | POA: Diagnosis not present

## 2022-01-03 DIAGNOSIS — Z20822 Contact with and (suspected) exposure to covid-19: Secondary | ICD-10-CM | POA: Diagnosis not present

## 2022-01-05 DIAGNOSIS — Z9049 Acquired absence of other specified parts of digestive tract: Secondary | ICD-10-CM | POA: Diagnosis not present

## 2022-01-05 DIAGNOSIS — I4819 Other persistent atrial fibrillation: Secondary | ICD-10-CM | POA: Diagnosis not present

## 2022-01-05 DIAGNOSIS — Z6832 Body mass index (BMI) 32.0-32.9, adult: Secondary | ICD-10-CM | POA: Diagnosis not present

## 2022-01-05 DIAGNOSIS — Z8249 Family history of ischemic heart disease and other diseases of the circulatory system: Secondary | ICD-10-CM | POA: Diagnosis not present

## 2022-01-05 DIAGNOSIS — M1990 Unspecified osteoarthritis, unspecified site: Secondary | ICD-10-CM | POA: Diagnosis not present

## 2022-01-05 DIAGNOSIS — E669 Obesity, unspecified: Secondary | ICD-10-CM | POA: Diagnosis not present

## 2022-01-05 DIAGNOSIS — I10 Essential (primary) hypertension: Secondary | ICD-10-CM | POA: Diagnosis not present

## 2022-01-05 DIAGNOSIS — Z7901 Long term (current) use of anticoagulants: Secondary | ICD-10-CM | POA: Diagnosis not present

## 2022-01-05 DIAGNOSIS — Z79899 Other long term (current) drug therapy: Secondary | ICD-10-CM | POA: Diagnosis not present

## 2022-01-06 ENCOUNTER — Other Ambulatory Visit: Payer: Self-pay

## 2022-01-06 DIAGNOSIS — Z9049 Acquired absence of other specified parts of digestive tract: Secondary | ICD-10-CM | POA: Diagnosis not present

## 2022-01-06 DIAGNOSIS — I4891 Unspecified atrial fibrillation: Secondary | ICD-10-CM | POA: Diagnosis not present

## 2022-01-06 DIAGNOSIS — Z6832 Body mass index (BMI) 32.0-32.9, adult: Secondary | ICD-10-CM | POA: Diagnosis not present

## 2022-01-06 DIAGNOSIS — Z8249 Family history of ischemic heart disease and other diseases of the circulatory system: Secondary | ICD-10-CM | POA: Diagnosis not present

## 2022-01-06 DIAGNOSIS — Z79899 Other long term (current) drug therapy: Secondary | ICD-10-CM | POA: Diagnosis not present

## 2022-01-06 DIAGNOSIS — E669 Obesity, unspecified: Secondary | ICD-10-CM | POA: Diagnosis not present

## 2022-01-06 DIAGNOSIS — I10 Essential (primary) hypertension: Secondary | ICD-10-CM | POA: Diagnosis not present

## 2022-01-06 DIAGNOSIS — M1990 Unspecified osteoarthritis, unspecified site: Secondary | ICD-10-CM | POA: Diagnosis not present

## 2022-01-06 DIAGNOSIS — I4819 Other persistent atrial fibrillation: Secondary | ICD-10-CM | POA: Diagnosis not present

## 2022-01-06 DIAGNOSIS — Z7901 Long term (current) use of anticoagulants: Secondary | ICD-10-CM | POA: Diagnosis not present

## 2022-01-06 MED ORDER — PANTOPRAZOLE SODIUM 20 MG PO TBEC
DELAYED_RELEASE_TABLET | ORAL | 0 refills | Status: AC
  Filled 2022-01-06: qty 60, 30d supply, fill #0

## 2022-01-06 MED ORDER — OMRON 3 SERIES BP MONITOR DEVI
0 refills | Status: AC
  Filled 2022-01-06: qty 1, 1d supply, fill #0

## 2022-01-06 MED ORDER — LOSARTAN POTASSIUM 25 MG PO TABS
25.0000 mg | ORAL_TABLET | Freq: Every day | ORAL | 1 refills | Status: DC
  Filled 2022-01-06: qty 30, 30d supply, fill #0
  Filled 2022-02-07: qty 30, 30d supply, fill #1

## 2022-01-12 ENCOUNTER — Other Ambulatory Visit: Payer: Self-pay

## 2022-01-19 DIAGNOSIS — Z9889 Other specified postprocedural states: Secondary | ICD-10-CM | POA: Diagnosis not present

## 2022-01-19 DIAGNOSIS — I10 Essential (primary) hypertension: Secondary | ICD-10-CM | POA: Diagnosis not present

## 2022-01-19 DIAGNOSIS — Z8679 Personal history of other diseases of the circulatory system: Secondary | ICD-10-CM | POA: Diagnosis not present

## 2022-02-07 ENCOUNTER — Other Ambulatory Visit: Payer: Self-pay

## 2022-02-13 ENCOUNTER — Other Ambulatory Visit: Payer: Self-pay

## 2022-02-14 ENCOUNTER — Other Ambulatory Visit: Payer: Self-pay

## 2022-02-15 ENCOUNTER — Other Ambulatory Visit: Payer: Self-pay

## 2022-02-21 DIAGNOSIS — Z8679 Personal history of other diseases of the circulatory system: Secondary | ICD-10-CM | POA: Diagnosis not present

## 2022-02-21 DIAGNOSIS — I4891 Unspecified atrial fibrillation: Secondary | ICD-10-CM | POA: Diagnosis not present

## 2022-02-21 DIAGNOSIS — G4733 Obstructive sleep apnea (adult) (pediatric): Secondary | ICD-10-CM | POA: Diagnosis not present

## 2022-02-21 DIAGNOSIS — I10 Essential (primary) hypertension: Secondary | ICD-10-CM | POA: Diagnosis not present

## 2022-02-21 DIAGNOSIS — E669 Obesity, unspecified: Secondary | ICD-10-CM | POA: Diagnosis not present

## 2022-02-21 DIAGNOSIS — R0602 Shortness of breath: Secondary | ICD-10-CM | POA: Diagnosis not present

## 2022-02-21 DIAGNOSIS — I4892 Unspecified atrial flutter: Secondary | ICD-10-CM | POA: Diagnosis not present

## 2022-02-21 DIAGNOSIS — Z9889 Other specified postprocedural states: Secondary | ICD-10-CM | POA: Diagnosis not present

## 2022-03-01 DIAGNOSIS — R0681 Apnea, not elsewhere classified: Secondary | ICD-10-CM | POA: Diagnosis not present

## 2022-03-01 DIAGNOSIS — I4891 Unspecified atrial fibrillation: Secondary | ICD-10-CM | POA: Diagnosis not present

## 2022-03-07 ENCOUNTER — Other Ambulatory Visit: Payer: Self-pay

## 2022-03-08 ENCOUNTER — Other Ambulatory Visit: Payer: Self-pay

## 2022-03-09 ENCOUNTER — Other Ambulatory Visit: Payer: Self-pay

## 2022-03-09 MED ORDER — LOSARTAN POTASSIUM 25 MG PO TABS
25.0000 mg | ORAL_TABLET | Freq: Every day | ORAL | 3 refills | Status: DC
  Filled 2022-03-09: qty 90, 90d supply, fill #0

## 2022-03-13 NOTE — Anesthesia Preprocedure Evaluation (Signed)
Anesthesia Evaluation  Patient identified by MRN, date of birth, ID band Patient awake    Reviewed: Allergy & Precautions, H&P , NPO status , Patient's Chart, lab work & pertinent test results, reviewed documented beta blocker date and time   History of Anesthesia Complications Negative for: history of anesthetic complications  Airway Mallampati: II  TM Distance: >3 FB Neck ROM: full    Dental  (+) Teeth Intact   Pulmonary sleep apnea (suspected) and Continuous Positive Airway Pressure Ventilation , pneumonia, resolved, neg COPD,    Pulmonary exam normal breath sounds clear to auscultation       Cardiovascular Exercise Tolerance: Good (-) angina(-) Past MI and (-) Cardiac Stents negative cardio ROS Normal cardiovascular exam+ dysrhythmias Atrial Fibrillation  Rhythm:irregular Rate:Normal     Neuro/Psych negative neurological ROS  negative psych ROS   GI/Hepatic negative GI ROS, Neg liver ROS,   Endo/Other  negative endocrine ROS  Renal/GU negative Renal ROS  negative genitourinary   Musculoskeletal   Abdominal   Peds  Hematology negative hematology ROS (+)   Anesthesia Other Findings Obese  Past Medical History: No date: Atrial fibrillation [facility])  Past Surgical History: 10/01/2020: CARDIOVERSION; N/A     Comment:  Procedure: CARDIOVERSION;  Surgeon: Alwyn Pea,               MD;  Location: [facility] ORS;  Service: Cardiovascular;                Laterality: N/A; No date: CHOLECYSTECTOMY  BMI    Body Mass Index: 31.87 kg/m      Reproductive/Obstetrics negative OB ROS                            Anesthesia Physical  Anesthesia Plan  ASA: 3  Anesthesia Plan: General   Post-op Pain Management:    Induction:   PONV Risk Score and Plan:   Airway Management Planned: Simple Face Mask  Additional Equipment:   Intra-op Plan:   Post-operative Plan:   Informed Consent:  I have reviewed the patients History and Physical, chart, labs and discussed the procedure including the risks, benefits and alternatives for the proposed anesthesia with the patient or authorized representative who has indicated his/her understanding and acceptance.     Dental Advisory Given  Plan Discussed with: Anesthesiologist, CRNA and Surgeon  Anesthesia Plan Comments:        Anesthesia Quick Evaluation

## 2022-03-14 ENCOUNTER — Encounter: Payer: Self-pay | Admitting: Internal Medicine

## 2022-03-14 ENCOUNTER — Ambulatory Visit: Payer: 59 | Admitting: Anesthesiology

## 2022-03-14 ENCOUNTER — Encounter
Admission: RE | Disposition: A | Discharge status: Home / Self Care | Payer: Self-pay | Source: Home / Self Care | Attending: Internal Medicine

## 2022-03-14 ENCOUNTER — Ambulatory Visit
Admission: RE | Admit: 2022-03-14 | Discharge: 2022-03-14 | Disposition: A | Discharge status: Home / Self Care | Payer: 59 | Attending: Internal Medicine | Admitting: Internal Medicine

## 2022-03-14 DIAGNOSIS — I4891 Unspecified atrial fibrillation: Secondary | ICD-10-CM | POA: Insufficient documentation

## 2022-03-14 DIAGNOSIS — J1289 Other viral pneumonia: Secondary | ICD-10-CM | POA: Diagnosis not present

## 2022-03-14 HISTORY — PX: CARDIOVERSION: SHX1299

## 2022-03-14 SURGERY — CARDIOVERSION
Anesthesia: General

## 2022-03-14 MED ORDER — PROPOFOL 10 MG/ML IV BOLUS
INTRAVENOUS | Status: DC | PRN
  Administered 2022-03-14: 60 mg via INTRAVENOUS

## 2022-03-14 MED ORDER — ONDANSETRON HCL 4 MG/2ML IJ SOLN: 4.0000 mg | Freq: Once | INTRAMUSCULAR | Status: DC | PRN

## 2022-03-14 MED ORDER — SODIUM CHLORIDE 0.9 % IV SOLN
INTRAVENOUS | Status: DC
  Administered 2022-03-14: 1000 mL via INTRAVENOUS

## 2022-03-14 MED ORDER — PHENYLEPHRINE HCL (PRESSORS) 10 MG/ML IV SOLN: INTRAVENOUS | Status: DC | PRN

## 2022-03-14 MED ORDER — PROPOFOL 10 MG/ML IV BOLUS
INTRAVENOUS | Status: AC
  Filled 2022-03-14: qty 40

## 2022-03-14 MED ORDER — FENTANYL CITRATE (PF) 100 MCG/2ML IJ SOLN: 25.0000 ug | INTRAMUSCULAR | Status: DC | PRN

## 2022-03-14 NOTE — CV Procedure (Signed)
Electrical Cardioversion Procedure Note   Procedure: Electrical Cardioversion Indications:  Atrial Fibrillation  Procedure Details Consent: Risks of procedure as well as the alternatives and risks of each were explained to the (patient/caregiver).  Consent for procedure obtained. Time Out: Verified patient identification, verified procedure, site/side was marked, verified correct patient position, special equipment/implants available, medications/allergies/relevent history reviewed, required imaging and test results available.  Performed  Patient placed on cardiac monitor, pulse oximetry, supplemental oxygen as necessary.  Sedation given:  Propofol as per anesthesia Pacer pads placed anterior and posterior chest.  Cardioverted 1 time(s).  Cardioverted at 120J.  Evaluation Findings: Post procedure EKG shows: NSR Complications: None Patient did tolerate procedure well.   Dorothyann Peng MD 03/11/2022 0730

## 2022-03-14 NOTE — Transfer of Care (Signed)
Immediate Anesthesia Transfer of Care Note  Patient: Danny Carson  Procedure(s) Performed: CARDIOVERSION  Patient Location: Special Procedures Unit  Anesthesia Type:General  Level of Consciousness: drowsy  Airway & Oxygen Therapy: Patient Spontanous Breathing and Patient connected to nasal cannula oxygen  Post-op Assessment: Report given to RN and Post -op Vital signs reviewed and stable  Post vital signs: Reviewed and stable  Last Vitals:  Vitals Value Taken Time  BP 138/83 03/14/22 0733  Temp    Pulse 63 03/14/22 0736  Resp 18 03/14/22 0736  SpO2 95 % 03/14/22 0736  Vitals shown include unvalidated device data.  Last Pain:  Vitals:   03/14/22 0704  TempSrc: Oral  PainSc: 0-No pain         Complications: No notable events documented.

## 2022-03-15 ENCOUNTER — Other Ambulatory Visit: Payer: Self-pay

## 2022-03-15 ENCOUNTER — Encounter: Payer: Self-pay | Admitting: Internal Medicine

## 2022-03-22 ENCOUNTER — Ambulatory Visit: Admit: 2022-03-22 | Payer: 59

## 2022-03-22 SURGERY — COLONOSCOPY WITH PROPOFOL
Anesthesia: General

## 2022-03-24 ENCOUNTER — Other Ambulatory Visit: Payer: Self-pay

## 2022-03-30 NOTE — Anesthesia Postprocedure Evaluation (Signed)
Anesthesia Post Note  Patient: Danny Carson  Procedure(s) Performed: CARDIOVERSION  Patient location during evaluation: PACU Anesthesia Type: General Level of consciousness: awake and alert Pain management: pain level controlled Vital Signs Assessment: post-procedure vital signs reviewed and stable Respiratory status: spontaneous breathing, nonlabored ventilation, respiratory function stable and patient connected to nasal cannula oxygen Cardiovascular status: blood pressure returned to baseline and stable Postop Assessment: no apparent nausea or vomiting Anesthetic complications: no   No notable events documented.   Last Vitals:  Vitals:   03/14/22 0800 03/14/22 0815  BP: (!) 153/99 (!) 163/98  Pulse: 64 67  Resp: 12 16  Temp:    SpO2: 97% 97%    Last Pain:  Vitals:   03/14/22 0815  TempSrc:   PainSc: 0-No pain                 Yevette Edwards

## 2022-04-06 ENCOUNTER — Other Ambulatory Visit: Payer: Self-pay

## 2022-04-06 DIAGNOSIS — R5383 Other fatigue: Secondary | ICD-10-CM | POA: Diagnosis not present

## 2022-04-06 DIAGNOSIS — Z9889 Other specified postprocedural states: Secondary | ICD-10-CM | POA: Diagnosis not present

## 2022-04-06 DIAGNOSIS — I10 Essential (primary) hypertension: Secondary | ICD-10-CM | POA: Diagnosis not present

## 2022-04-06 DIAGNOSIS — I4819 Other persistent atrial fibrillation: Secondary | ICD-10-CM | POA: Diagnosis not present

## 2022-04-06 DIAGNOSIS — E669 Obesity, unspecified: Secondary | ICD-10-CM | POA: Diagnosis not present

## 2022-04-06 DIAGNOSIS — G4733 Obstructive sleep apnea (adult) (pediatric): Secondary | ICD-10-CM | POA: Diagnosis not present

## 2022-04-06 DIAGNOSIS — I4891 Unspecified atrial fibrillation: Secondary | ICD-10-CM | POA: Diagnosis not present

## 2022-04-06 DIAGNOSIS — R0602 Shortness of breath: Secondary | ICD-10-CM | POA: Diagnosis not present

## 2022-04-06 DIAGNOSIS — I4892 Unspecified atrial flutter: Secondary | ICD-10-CM | POA: Diagnosis not present

## 2022-04-06 MED ORDER — METOPROLOL TARTRATE 25 MG PO TABS
25.0000 mg | ORAL_TABLET | Freq: Two times a day (BID) | ORAL | 7 refills | Status: DC
  Filled 2022-04-06: qty 90, 45d supply, fill #0
  Filled 2022-05-22: qty 90, 45d supply, fill #1
  Filled 2022-07-12: qty 90, 45d supply, fill #2

## 2022-04-06 MED ORDER — LOSARTAN POTASSIUM 50 MG PO TABS
50.0000 mg | ORAL_TABLET | Freq: Every day | ORAL | 3 refills | Status: AC
  Filled 2022-04-06: qty 90, 90d supply, fill #0

## 2022-04-11 ENCOUNTER — Other Ambulatory Visit: Payer: Self-pay

## 2022-04-11 MED ORDER — XARELTO 20 MG PO TABS
ORAL_TABLET | ORAL | 11 refills | Status: AC
Start: 2022-04-11 — End: ?
  Filled 2022-04-11: qty 30, 30d supply, fill #0
  Filled 2022-05-15: qty 30, 30d supply, fill #1
  Filled 2022-06-19: qty 30, 30d supply, fill #2
  Filled 2022-07-17: qty 30, 30d supply, fill #3
  Filled 2022-08-14: qty 30, 30d supply, fill #4
  Filled 2022-09-12: qty 30, 30d supply, fill #5

## 2022-04-18 DIAGNOSIS — R7989 Other specified abnormal findings of blood chemistry: Secondary | ICD-10-CM | POA: Diagnosis not present

## 2022-04-18 DIAGNOSIS — I10 Essential (primary) hypertension: Secondary | ICD-10-CM | POA: Diagnosis not present

## 2022-04-18 DIAGNOSIS — E7800 Pure hypercholesterolemia, unspecified: Secondary | ICD-10-CM | POA: Diagnosis not present

## 2022-04-18 DIAGNOSIS — Z0000 Encounter for general adult medical examination without abnormal findings: Secondary | ICD-10-CM | POA: Diagnosis not present

## 2022-04-18 DIAGNOSIS — Z125 Encounter for screening for malignant neoplasm of prostate: Secondary | ICD-10-CM | POA: Diagnosis not present

## 2022-04-18 DIAGNOSIS — Z1331 Encounter for screening for depression: Secondary | ICD-10-CM | POA: Diagnosis not present

## 2022-04-18 DIAGNOSIS — N529 Male erectile dysfunction, unspecified: Secondary | ICD-10-CM | POA: Diagnosis not present

## 2022-05-03 ENCOUNTER — Encounter
Admission: RE | Disposition: A | Discharge status: Home / Self Care | Payer: Self-pay | Source: Ambulatory Visit | Attending: Gastroenterology

## 2022-05-03 ENCOUNTER — Encounter: Payer: Self-pay | Admitting: General Practice

## 2022-05-03 ENCOUNTER — Encounter: Payer: Self-pay | Admitting: *Deleted

## 2022-05-03 ENCOUNTER — Other Ambulatory Visit: Payer: Self-pay

## 2022-05-03 ENCOUNTER — Ambulatory Visit
Admission: RE | Admit: 2022-05-03 | Discharge: 2022-05-03 | Disposition: A | Discharge status: Home / Self Care | Payer: 59 | Source: Ambulatory Visit | Attending: Gastroenterology | Admitting: Gastroenterology

## 2022-05-03 DIAGNOSIS — Z539 Procedure and treatment not carried out, unspecified reason: Secondary | ICD-10-CM | POA: Insufficient documentation

## 2022-05-03 DIAGNOSIS — Z1211 Encounter for screening for malignant neoplasm of colon: Secondary | ICD-10-CM | POA: Insufficient documentation

## 2022-05-03 HISTORY — DX: Essential (primary) hypertension: I10

## 2022-05-03 HISTORY — DX: Cardiac arrhythmia, unspecified: I49.9

## 2022-05-03 SURGERY — COLONOSCOPY WITH PROPOFOL
Anesthesia: General

## 2022-05-03 MED ORDER — SODIUM CHLORIDE 0.9 % IV SOLN: INTRAVENOUS | Status: DC

## 2022-05-03 MED ORDER — HYDRALAZINE HCL 20 MG/ML IJ SOLN
INTRAMUSCULAR | Status: AC
  Filled 2022-05-03: qty 1

## 2022-05-03 MED ORDER — HYDRALAZINE HCL 20 MG/ML IJ SOLN
10.0000 mg | Freq: Once | INTRAMUSCULAR | Status: AC
  Administered 2022-05-03: 10 mg via INTRAVENOUS

## 2022-05-03 NOTE — Anesthesia Preprocedure Evaluation (Addendum)
Anesthesia Evaluation  Patient identified by MRN, date of birth, ID band Patient awake    Reviewed: Allergy & Precautions, NPO status , Patient's Chart, lab work & pertinent test results  Airway Mallampati: III  TM Distance: >3 FB Neck ROM: full    Dental  (+) Chipped   Pulmonary neg pulmonary ROS,    Pulmonary exam normal        Cardiovascular hypertension, negative cardio ROS Normal cardiovascular exam     Neuro/Psych negative neurological ROS  negative psych ROS   GI/Hepatic negative GI ROS, Neg liver ROS,   Endo/Other  negative endocrine ROS  Renal/GU negative Renal ROS  negative genitourinary   Musculoskeletal   Abdominal   Peds  Hematology negative hematology ROS (+)   Anesthesia Other Findings Patient with BP of 198/100 today. Patient was seen in his PCP office a few weeks ago with elevated blood pressure. Treated BP with 10 of hydralazine. BP remains elevated at 190/88. Discussed the risks of stroke and heart attack with the patient and he stated he wanted to reschedule.   Past Medical History: No date: Atrial fibrillation (HCC) No date: Dysrhythmia No date: Hypertension  Past Surgical History: 2023: ATRIAL FIBRILLATION ABLATION; N/A 10/01/2020: CARDIOVERSION; N/A     Comment:  Procedure: CARDIOVERSION;  Surgeon: Alwyn Pea,               MD;  Location: [facility] ORS;  Service: Cardiovascular;                Laterality: N/A; 12/17/2020: CARDIOVERSION; N/A     Comment:  Procedure: CARDIOVERSION;  Surgeon: Alwyn Pea,               MD;  Location: [facility] ORS;  Service: Cardiovascular;                Laterality: N/A; 03/14/2022: CARDIOVERSION; N/A     Comment:  Procedure: CARDIOVERSION;  Surgeon: Alwyn Pea,               MD;  Location: [facility] ORS;  Service: Cardiovascular;                Laterality: N/A; No date: CHOLECYSTECTOMY  BMI    Body Mass Index: 31.06 kg/m       Reproductive/Obstetrics negative OB ROS                            Anesthesia Physical Anesthesia Plan  ASA: 3  Anesthesia Plan: General   Post-op Pain Management: Minimal or no pain anticipated   Induction: Intravenous  PONV Risk Score and Plan: 3 and Propofol infusion, TIVA and Ondansetron  Airway Management Planned: Nasal Cannula  Additional Equipment: None  Intra-op Plan:   Post-operative Plan:   Informed Consent: I have reviewed the patients History and Physical, chart, labs and discussed the procedure including the risks, benefits and alternatives for the proposed anesthesia with the patient or authorized representative who has indicated his/her understanding and acceptance.     Dental advisory given  Plan Discussed with: CRNA and Surgeon  Anesthesia Plan Comments: (Discussed risks of anesthesia with patient, including possibility of difficulty with spontaneous ventilation under anesthesia necessitating airway intervention, PONV, and rare risks such as cardiac or respiratory or neurological events, and allergic reactions. Discussed the role of CRNA in patient's perioperative care. Patient understands.)        Anesthesia Quick Evaluation

## 2022-05-03 NOTE — OR Nursing (Signed)
PT WITH MINIMAL RESPONSE TO HYDRALAZINE 10MG  IV.BP 190/84 WITH HEART RATE 59. DR WOODSON  ANESTHESIA CANCELLED PROCEDURE

## 2022-05-03 NOTE — OR Nursing (Signed)
PT TOOK  XARELTO 20MG  PO TODAY. REPORTS HE HAS NOT HELD MED . DR NOTIFIED

## 2022-05-03 NOTE — OR Nursing (Signed)
DR Lorette Ang ANESTHESIA TO EVAL PT. BP ELEVATED 198/90 . HEART RATE 57. MD ORDERS HYDRALAZINE 10MG  IV  AND REPEAT BP . PT TOOK ALL BP MEDS TODAY ALONG WITH XARELTO 20MG 

## 2022-05-09 ENCOUNTER — Other Ambulatory Visit: Payer: Self-pay

## 2022-05-09 DIAGNOSIS — I10 Essential (primary) hypertension: Secondary | ICD-10-CM | POA: Diagnosis not present

## 2022-05-09 MED ORDER — LOSARTAN POTASSIUM-HCTZ 50-12.5 MG PO TABS
1.0000 | ORAL_TABLET | Freq: Every day | ORAL | 3 refills | Status: AC
  Filled 2022-05-09: qty 90, 90d supply, fill #0

## 2022-05-15 ENCOUNTER — Other Ambulatory Visit: Payer: Self-pay

## 2022-05-16 ENCOUNTER — Other Ambulatory Visit: Payer: Self-pay

## 2022-05-22 ENCOUNTER — Other Ambulatory Visit: Payer: Self-pay

## 2022-05-23 ENCOUNTER — Other Ambulatory Visit: Payer: Self-pay

## 2022-06-07 ENCOUNTER — Other Ambulatory Visit: Payer: Self-pay

## 2022-06-07 DIAGNOSIS — R739 Hyperglycemia, unspecified: Secondary | ICD-10-CM | POA: Diagnosis not present

## 2022-06-07 DIAGNOSIS — I10 Essential (primary) hypertension: Secondary | ICD-10-CM | POA: Diagnosis not present

## 2022-06-07 MED ORDER — LOSARTAN POTASSIUM-HCTZ 100-25 MG PO TABS
1.0000 | ORAL_TABLET | Freq: Every day | ORAL | 3 refills | Status: DC
  Filled 2022-06-07: qty 90, 90d supply, fill #0

## 2022-06-19 ENCOUNTER — Other Ambulatory Visit: Payer: Self-pay

## 2022-06-20 ENCOUNTER — Other Ambulatory Visit: Payer: Self-pay

## 2022-07-07 DIAGNOSIS — Z760 Encounter for issue of repeat prescription: Secondary | ICD-10-CM | POA: Diagnosis not present

## 2022-07-12 ENCOUNTER — Other Ambulatory Visit: Payer: Self-pay

## 2022-07-18 ENCOUNTER — Other Ambulatory Visit: Payer: Self-pay

## 2022-08-01 DIAGNOSIS — R7989 Other specified abnormal findings of blood chemistry: Secondary | ICD-10-CM | POA: Diagnosis not present

## 2022-08-01 DIAGNOSIS — I10 Essential (primary) hypertension: Secondary | ICD-10-CM | POA: Diagnosis not present

## 2022-08-15 ENCOUNTER — Ambulatory Visit (INDEPENDENT_AMBULATORY_CARE_PROVIDER_SITE_OTHER): Payer: 59 | Admitting: Urology

## 2022-08-15 ENCOUNTER — Other Ambulatory Visit: Payer: Self-pay

## 2022-08-15 ENCOUNTER — Encounter: Payer: Self-pay | Admitting: Urology

## 2022-08-15 VITALS — BP 171/84 | HR 65 | Ht 72.0 in | Wt 230.0 lb

## 2022-08-15 DIAGNOSIS — E221 Hyperprolactinemia: Secondary | ICD-10-CM

## 2022-08-15 DIAGNOSIS — E291 Testicular hypofunction: Secondary | ICD-10-CM | POA: Diagnosis not present

## 2022-08-15 NOTE — Progress Notes (Signed)
08/15/2022 8:44 AM   Carney Bern 10/18/1955 [id number]  Referring provider: Rolm Gala, MD [address], [address] [place],  [place] [address]  Chief Complaint  Patient presents with   Hypogonadism    HPI: Danny Carson is a 66 y.o. male referred for evaluation of hypogonadism.  Testosterone level drawn 05/19/2021 was low at 189 ng/dL.  LH low normal at 2.7 and prolactin was elevated at 125 ng/mL Repeat total testosterone level 04/18/2022 was 199 ng/dL. PSA 04/18/2022 was 0.26 Long history of tiredness and fatigue.  States he was on topical testosterone several years ago.  He was seen by urology [facility] in 2022 and a second a.m. testosterone level was recommended and he did not get done until recently. No prior history of elevated prolactin and has not seen endocrinology Does have ED but significant organic risk factors including hypertension and several antihypertensive medications including beta-blockers     PMH: Past Medical History:  Diagnosis Date   Atrial fibrillation (HCC)    Dysrhythmia    Hypertension     Surgical History: Past Surgical History:  Procedure Laterality Date   ATRIAL FIBRILLATION ABLATION N/A 2023   CARDIOVERSION N/A 10/01/2020   Procedure: CARDIOVERSION;  Surgeon: Alwyn Pea, MD;  Location: [facility] ORS;  Service: Cardiovascular;  Laterality: N/A;   CARDIOVERSION N/A 12/17/2020   Procedure: CARDIOVERSION;  Surgeon: Alwyn Pea, MD;  Location: [facility] ORS;  Service: Cardiovascular;  Laterality: N/A;   CARDIOVERSION N/A 03/14/2022   Procedure: CARDIOVERSION;  Surgeon: Alwyn Pea, MD;  Location: [facility] ORS;  Service: Cardiovascular;  Laterality: N/A;   CHOLECYSTECTOMY      Home Medications:  Allergies as of 08/15/2022       Reactions   Latex Swelling   Ace Inhibitors Cough        Medication List        Accurate as of August 15, 2022  8:44 AM. If you have any questions, ask your nurse or doctor.           STOP taking these medications    amiodarone 200 MG tablet Commonly known as: PACERONE Stopped by: Riki Altes, MD       TAKE these medications    acetaminophen 325 MG tablet Commonly known as: TYLENOL Take 2 tablets (650 mg total) by mouth every 6 (six) hours as needed for mild pain or headache (fever >/= 101). What changed: Another medication with the same name was removed. Continue taking this medication, and follow the directions you see here. Changed by: Riki Altes, MD   diltiazem 120 MG 24 hr capsule Commonly known as: CARDIZEM CD TAKE 1 CAPSULE BY MOUTH ONCE DAILY   diltiazem 120 MG 24 hr capsule Commonly known as: CARDIZEM CD Take 120 mg by mouth daily with supper.   losartan 50 MG tablet Commonly known as: COZAAR Take 1 tablet (50 mg total) by mouth once daily What changed: Another medication with the same name was removed. Continue taking this medication, and follow the directions you see here. Changed by: Riki Altes, MD   losartan-hydrochlorothiazide 50-12.5 MG tablet Commonly known as: HYZAAR Take 1 tablet by mouth once daily What changed: Another medication with the same name was removed. Continue taking this medication, and follow the directions you see here. Changed by: Riki Altes, MD   metoprolol tartrate 50 MG tablet Commonly known as: LOPRESSOR Take 1 tablet (50 mg total) by mouth 2 (two) times daily What changed:  Another medication with the same name was removed. Continue taking this medication, and follow the directions you see here. Changed by: Riki Altes, MD   Omron 3 Series BP Monitor Devi use as directed   pantoprazole 20 MG tablet Commonly known as: PROTONIX Take 1 tablet (20 mg total) by mouth every 12 (twelve) hours (Take 1 tablet (20 mg total) by mouth every 12 (twelve) hours for 30 days)   polyethylene glycol-electrolytes 420 g solution Commonly known as: NuLYTELY Use as directed.   triamcinolone cream 0.1  % Commonly known as: KENALOG Apply topically 2 (two) times daily What changed: Another medication with the same name was removed. Continue taking this medication, and follow the directions you see here. Changed by: Riki Altes, MD   Xarelto 20 MG Tabs tablet Generic drug: rivaroxaban Take 20 mg by mouth daily with supper. What changed: Another medication with the same name was removed. Continue taking this medication, and follow the directions you see here. Changed by: Riki Altes, MD   Xarelto 20 MG Tabs tablet Generic drug: rivaroxaban Take 1 tablet (20 mg total) by mouth daily with dinner What changed: Another medication with the same name was removed. Continue taking this medication, and follow the directions you see here. Changed by: Riki Altes, MD        Allergies:  Allergies  Allergen Reactions   Latex Swelling   Ace Inhibitors Cough    Family History: No family history on file.  Social History:  reports that he has never smoked. He has never used smokeless tobacco. He reports that he does not drink alcohol and does not use drugs.   Physical Exam: BP (!) 171/84   Pulse 65   Ht 6' (1.829 m)   Wt 230 lb (104.3 kg)   BMI 31.19 kg/m   Constitutional:  Alert and oriented, No acute distress. Psychiatric: Normal mood and affect.   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, [address] [place], [place] [address] [phone]

## 2022-08-29 ENCOUNTER — Other Ambulatory Visit: Payer: Self-pay

## 2022-08-29 MED ORDER — METOPROLOL TARTRATE 25 MG PO TABS
25.0000 mg | ORAL_TABLET | Freq: Two times a day (BID) | ORAL | 7 refills | Status: DC
Start: 2022-08-29 — End: 2023-09-22
  Filled 2022-08-29: qty 180, 90d supply, fill #0
  Filled 2022-12-11: qty 180, 90d supply, fill #1
  Filled 2023-03-13: qty 180, 90d supply, fill #2
  Filled 2023-06-17: qty 180, 90d supply, fill #3

## 2022-09-05 DIAGNOSIS — R7989 Other specified abnormal findings of blood chemistry: Secondary | ICD-10-CM | POA: Diagnosis not present

## 2022-09-05 DIAGNOSIS — N529 Male erectile dysfunction, unspecified: Secondary | ICD-10-CM | POA: Diagnosis not present

## 2022-09-05 DIAGNOSIS — I10 Essential (primary) hypertension: Secondary | ICD-10-CM | POA: Diagnosis not present

## 2022-09-05 DIAGNOSIS — R7303 Prediabetes: Secondary | ICD-10-CM | POA: Diagnosis not present

## 2022-09-12 ENCOUNTER — Other Ambulatory Visit: Payer: Self-pay

## 2022-10-04 ENCOUNTER — Other Ambulatory Visit: Payer: Self-pay

## 2022-10-06 ENCOUNTER — Other Ambulatory Visit: Payer: Self-pay

## 2022-10-07 ENCOUNTER — Other Ambulatory Visit: Payer: Self-pay

## 2022-10-07 MED ORDER — LOSARTAN POTASSIUM-HCTZ 100-25 MG PO TABS
1.0000 | ORAL_TABLET | Freq: Every day | ORAL | 2 refills | Status: DC
  Filled 2022-10-07: qty 90, 90d supply, fill #0
  Filled 2022-12-26: qty 90, 90d supply, fill #1
  Filled 2023-04-01: qty 90, 90d supply, fill #2

## 2022-10-10 DIAGNOSIS — I4891 Unspecified atrial fibrillation: Secondary | ICD-10-CM | POA: Diagnosis not present

## 2022-10-10 DIAGNOSIS — I10 Essential (primary) hypertension: Secondary | ICD-10-CM | POA: Diagnosis not present

## 2022-12-05 DIAGNOSIS — R7989 Other specified abnormal findings of blood chemistry: Secondary | ICD-10-CM | POA: Diagnosis not present

## 2022-12-05 DIAGNOSIS — Z1211 Encounter for screening for malignant neoplasm of colon: Secondary | ICD-10-CM | POA: Diagnosis not present

## 2022-12-05 DIAGNOSIS — D229 Melanocytic nevi, unspecified: Secondary | ICD-10-CM | POA: Diagnosis not present

## 2022-12-05 DIAGNOSIS — I10 Essential (primary) hypertension: Secondary | ICD-10-CM | POA: Diagnosis not present

## 2022-12-05 DIAGNOSIS — R7303 Prediabetes: Secondary | ICD-10-CM | POA: Diagnosis not present

## 2022-12-06 ENCOUNTER — Other Ambulatory Visit: Payer: Self-pay

## 2022-12-06 MED ORDER — METFORMIN HCL ER 500 MG PO TB24
500.0000 mg | ORAL_TABLET | Freq: Two times a day (BID) | ORAL | 3 refills | Status: AC
  Filled 2022-12-06: qty 180, 90d supply, fill #0
  Filled 2023-02-07: qty 180, 90d supply, fill #1

## 2022-12-26 DIAGNOSIS — L409 Psoriasis, unspecified: Secondary | ICD-10-CM | POA: Diagnosis not present

## 2022-12-26 DIAGNOSIS — L821 Other seborrheic keratosis: Secondary | ICD-10-CM | POA: Diagnosis not present

## 2023-01-09 DIAGNOSIS — Z1283 Encounter for screening for malignant neoplasm of skin: Secondary | ICD-10-CM | POA: Diagnosis not present

## 2023-01-09 DIAGNOSIS — B079 Viral wart, unspecified: Secondary | ICD-10-CM | POA: Diagnosis not present

## 2023-01-09 DIAGNOSIS — L821 Other seborrheic keratosis: Secondary | ICD-10-CM | POA: Diagnosis not present

## 2023-01-09 DIAGNOSIS — L814 Other melanin hyperpigmentation: Secondary | ICD-10-CM | POA: Diagnosis not present

## 2023-01-09 DIAGNOSIS — D229 Melanocytic nevi, unspecified: Secondary | ICD-10-CM | POA: Diagnosis not present

## 2023-01-09 DIAGNOSIS — D1801 Hemangioma of skin and subcutaneous tissue: Secondary | ICD-10-CM | POA: Diagnosis not present

## 2023-01-16 ENCOUNTER — Other Ambulatory Visit: Payer: Self-pay

## 2023-01-16 DIAGNOSIS — E119 Type 2 diabetes mellitus without complications: Secondary | ICD-10-CM | POA: Diagnosis not present

## 2023-01-16 MED ORDER — FREESTYLE LITE TEST VI STRP
1.0000 | ORAL_STRIP | Freq: Three times a day (TID) | 3 refills | Status: AC
  Filled 2023-01-16: qty 100, 30d supply, fill #0
  Filled 2023-04-02: qty 100, 30d supply, fill #1

## 2023-01-16 MED ORDER — METFORMIN HCL ER 500 MG PO TB24
1000.0000 mg | ORAL_TABLET | Freq: Two times a day (BID) | ORAL | 3 refills | Status: DC | PRN
  Filled 2023-01-16 – 2023-02-07 (×2): qty 360, 90d supply, fill #0

## 2023-01-16 MED ORDER — FREESTYLE FREEDOM LITE W/DEVICE KIT
PACK | 0 refills | Status: DC
  Filled 2023-01-16: qty 1, 1d supply, fill #0

## 2023-01-16 MED ORDER — FREESTYLE LANCETS MISC
1.0000 | Freq: Three times a day (TID) | 3 refills | Status: AC
  Filled 2023-01-16: qty 100, 30d supply, fill #0
  Filled 2023-04-02: qty 100, 30d supply, fill #1

## 2023-02-07 ENCOUNTER — Other Ambulatory Visit: Payer: Self-pay

## 2023-02-28 DIAGNOSIS — Z1211 Encounter for screening for malignant neoplasm of colon: Secondary | ICD-10-CM | POA: Diagnosis not present

## 2023-04-02 ENCOUNTER — Other Ambulatory Visit: Payer: Self-pay

## 2023-05-01 ENCOUNTER — Other Ambulatory Visit: Payer: Self-pay

## 2023-05-01 DIAGNOSIS — E119 Type 2 diabetes mellitus without complications: Secondary | ICD-10-CM | POA: Diagnosis not present

## 2023-05-01 DIAGNOSIS — Z0001 Encounter for general adult medical examination with abnormal findings: Secondary | ICD-10-CM | POA: Diagnosis not present

## 2023-05-01 DIAGNOSIS — Z125 Encounter for screening for malignant neoplasm of prostate: Secondary | ICD-10-CM | POA: Diagnosis not present

## 2023-05-01 DIAGNOSIS — E7800 Pure hypercholesterolemia, unspecified: Secondary | ICD-10-CM | POA: Diagnosis not present

## 2023-05-01 DIAGNOSIS — Z1331 Encounter for screening for depression: Secondary | ICD-10-CM | POA: Diagnosis not present

## 2023-05-01 DIAGNOSIS — I10 Essential (primary) hypertension: Secondary | ICD-10-CM | POA: Diagnosis not present

## 2023-05-01 MED ORDER — OZEMPIC (0.25 OR 0.5 MG/DOSE) 2 MG/3ML ~~LOC~~ SOPN
0.2500 mg | PEN_INJECTOR | SUBCUTANEOUS | 0 refills | Status: DC
  Filled 2023-05-01 – 2023-05-08 (×2): qty 3, 28d supply, fill #0

## 2023-05-02 ENCOUNTER — Other Ambulatory Visit: Payer: Self-pay

## 2023-05-08 ENCOUNTER — Other Ambulatory Visit: Payer: Self-pay

## 2023-05-08 DIAGNOSIS — R7989 Other specified abnormal findings of blood chemistry: Secondary | ICD-10-CM | POA: Diagnosis not present

## 2023-05-08 DIAGNOSIS — E236 Other disorders of pituitary gland: Secondary | ICD-10-CM | POA: Diagnosis not present

## 2023-05-22 DIAGNOSIS — R001 Bradycardia, unspecified: Secondary | ICD-10-CM | POA: Diagnosis not present

## 2023-05-22 DIAGNOSIS — G4733 Obstructive sleep apnea (adult) (pediatric): Secondary | ICD-10-CM | POA: Diagnosis not present

## 2023-05-22 DIAGNOSIS — R0602 Shortness of breath: Secondary | ICD-10-CM | POA: Diagnosis not present

## 2023-05-22 DIAGNOSIS — E669 Obesity, unspecified: Secondary | ICD-10-CM | POA: Diagnosis not present

## 2023-05-22 DIAGNOSIS — E6609 Other obesity due to excess calories: Secondary | ICD-10-CM | POA: Diagnosis not present

## 2023-05-22 DIAGNOSIS — I4891 Unspecified atrial fibrillation: Secondary | ICD-10-CM | POA: Diagnosis not present

## 2023-05-22 DIAGNOSIS — R5383 Other fatigue: Secondary | ICD-10-CM | POA: Diagnosis not present

## 2023-05-22 DIAGNOSIS — Z8679 Personal history of other diseases of the circulatory system: Secondary | ICD-10-CM | POA: Diagnosis not present

## 2023-05-22 DIAGNOSIS — Z9889 Other specified postprocedural states: Secondary | ICD-10-CM | POA: Diagnosis not present

## 2023-05-22 DIAGNOSIS — I10 Essential (primary) hypertension: Secondary | ICD-10-CM | POA: Diagnosis not present

## 2023-05-26 ENCOUNTER — Other Ambulatory Visit: Payer: Self-pay

## 2023-05-26 MED ORDER — SEMAGLUTIDE(0.25 OR 0.5MG/DOS) 2 MG/3ML ~~LOC~~ SOPN
0.2500 mg | PEN_INJECTOR | SUBCUTANEOUS | 0 refills | Status: DC
  Filled 2023-05-26: qty 6, 56d supply, fill #0
  Filled 2023-06-17: qty 3, 28d supply, fill #0
  Filled 2023-07-23: qty 3, 28d supply, fill #1

## 2023-06-06 DIAGNOSIS — D352 Benign neoplasm of pituitary gland: Secondary | ICD-10-CM | POA: Diagnosis not present

## 2023-06-06 DIAGNOSIS — R7989 Other specified abnormal findings of blood chemistry: Secondary | ICD-10-CM | POA: Diagnosis not present

## 2023-06-12 DIAGNOSIS — R7989 Other specified abnormal findings of blood chemistry: Secondary | ICD-10-CM | POA: Diagnosis not present

## 2023-06-18 ENCOUNTER — Other Ambulatory Visit: Payer: Self-pay

## 2023-06-27 ENCOUNTER — Other Ambulatory Visit: Payer: Self-pay

## 2023-06-27 MED ORDER — INSULIN PEN NEEDLE 32G X 4 MM MISC
12 refills | Status: AC
  Filled 2023-06-27: qty 200, 90d supply, fill #0

## 2023-06-27 MED ORDER — TESTOSTERONE CYPIONATE 200 MG/ML IM SOLN
100.0000 mg | INTRAMUSCULAR | 2 refills | Status: AC
  Filled 2023-06-27 – 2023-07-24 (×2): qty 2, 28d supply, fill #0
  Filled 2023-08-15 – 2023-08-22 (×2): qty 2, 28d supply, fill #1
  Filled 2023-09-18: qty 2, 28d supply, fill #2

## 2023-06-27 MED ORDER — "HYPODERMIC NEEDLE 18G X 1"" MISC": 0 refills | Status: DC

## 2023-06-27 MED ORDER — "MONOJECT TB SYRINGE 28G X 1/2"" 1 ML MISC": 0 refills | Status: AC

## 2023-06-27 MED ORDER — "SYRINGE 22G X 1"" 3 ML MISC": 1 refills | Status: AC

## 2023-07-03 ENCOUNTER — Other Ambulatory Visit: Payer: Self-pay

## 2023-07-04 ENCOUNTER — Other Ambulatory Visit: Payer: Self-pay

## 2023-07-04 MED ORDER — LOSARTAN POTASSIUM-HCTZ 100-25 MG PO TABS
1.0000 | ORAL_TABLET | Freq: Every day | ORAL | 3 refills | Status: DC
  Filled 2023-07-04: qty 90, 90d supply, fill #0
  Filled 2023-09-22: qty 90, 90d supply, fill #1
  Filled 2024-01-01: qty 90, 90d supply, fill #2
  Filled 2024-03-28: qty 90, 90d supply, fill #3

## 2023-07-23 ENCOUNTER — Other Ambulatory Visit: Payer: Self-pay

## 2023-07-24 ENCOUNTER — Other Ambulatory Visit: Payer: Self-pay

## 2023-07-24 MED ORDER — OZEMPIC (0.25 OR 0.5 MG/DOSE) 2 MG/3ML ~~LOC~~ SOPN
0.5000 mg | PEN_INJECTOR | SUBCUTANEOUS | 0 refills | Status: DC
  Filled 2023-07-24: qty 9, 84d supply, fill #0

## 2023-07-25 ENCOUNTER — Other Ambulatory Visit: Payer: Self-pay

## 2023-08-07 ENCOUNTER — Other Ambulatory Visit: Payer: Self-pay

## 2023-08-07 DIAGNOSIS — E119 Type 2 diabetes mellitus without complications: Secondary | ICD-10-CM | POA: Diagnosis not present

## 2023-08-07 DIAGNOSIS — F418 Other specified anxiety disorders: Secondary | ICD-10-CM | POA: Diagnosis not present

## 2023-08-07 DIAGNOSIS — R7989 Other specified abnormal findings of blood chemistry: Secondary | ICD-10-CM | POA: Diagnosis not present

## 2023-08-07 MED ORDER — CLONAZEPAM 0.5 MG PO TABS
0.5000 mg | ORAL_TABLET | Freq: Every day | ORAL | 0 refills | Status: AC
  Filled 2023-08-07: qty 6, 6d supply, fill #0

## 2023-08-07 MED ORDER — OZEMPIC (1 MG/DOSE) 4 MG/3ML ~~LOC~~ SOPN
1.0000 mg | PEN_INJECTOR | SUBCUTANEOUS | 3 refills | Status: DC
  Filled 2023-08-07: qty 9, 112d supply, fill #0
  Filled 2023-10-02: qty 3, 28d supply, fill #0

## 2023-08-08 ENCOUNTER — Other Ambulatory Visit: Payer: Self-pay

## 2023-08-15 ENCOUNTER — Other Ambulatory Visit: Payer: Self-pay

## 2023-08-17 ENCOUNTER — Other Ambulatory Visit: Payer: Self-pay

## 2023-08-22 ENCOUNTER — Other Ambulatory Visit: Payer: Self-pay

## 2023-08-23 ENCOUNTER — Other Ambulatory Visit: Payer: Self-pay

## 2023-08-23 DIAGNOSIS — M549 Dorsalgia, unspecified: Secondary | ICD-10-CM | POA: Diagnosis not present

## 2023-08-23 MED ORDER — DICLOFENAC SODIUM 1 % EX GEL
2.0000 g | Freq: Four times a day (QID) | CUTANEOUS | 11 refills | Status: DC
  Filled 2023-08-23: qty 100, 12d supply, fill #0

## 2023-08-23 MED ORDER — NAPROXEN 500 MG PO TABS
500.0000 mg | ORAL_TABLET | Freq: Two times a day (BID) | ORAL | 0 refills | Status: AC
  Filled 2023-08-23: qty 30, 15d supply, fill #0

## 2023-08-23 MED ORDER — CYCLOBENZAPRINE HCL 5 MG PO TABS
5.0000 mg | ORAL_TABLET | Freq: Three times a day (TID) | ORAL | 0 refills | Status: AC | PRN
  Filled 2023-08-23: qty 30, 10d supply, fill #0

## 2023-09-11 ENCOUNTER — Other Ambulatory Visit: Payer: Self-pay

## 2023-09-18 ENCOUNTER — Other Ambulatory Visit: Payer: Self-pay

## 2023-09-22 ENCOUNTER — Other Ambulatory Visit: Payer: Self-pay

## 2023-09-22 MED ORDER — METOPROLOL TARTRATE 25 MG PO TABS
25.0000 mg | ORAL_TABLET | Freq: Two times a day (BID) | ORAL | 3 refills | Status: AC
  Filled 2023-09-22: qty 180, 90d supply, fill #0
  Filled 2024-01-01: qty 180, 90d supply, fill #1
  Filled 2024-03-28: qty 180, 90d supply, fill #2
  Filled 2024-07-08: qty 180, 90d supply, fill #3

## 2023-10-02 ENCOUNTER — Other Ambulatory Visit: Payer: Self-pay

## 2023-10-24 DIAGNOSIS — R7989 Other specified abnormal findings of blood chemistry: Secondary | ICD-10-CM | POA: Diagnosis not present

## 2023-10-30 DIAGNOSIS — Z79899 Other long term (current) drug therapy: Secondary | ICD-10-CM | POA: Diagnosis not present

## 2023-10-30 DIAGNOSIS — E291 Testicular hypofunction: Secondary | ICD-10-CM | POA: Diagnosis not present

## 2023-10-30 DIAGNOSIS — D352 Benign neoplasm of pituitary gland: Secondary | ICD-10-CM | POA: Diagnosis not present

## 2023-10-30 DIAGNOSIS — R7989 Other specified abnormal findings of blood chemistry: Secondary | ICD-10-CM | POA: Diagnosis not present

## 2023-10-30 DIAGNOSIS — Z2821 Immunization not carried out because of patient refusal: Secondary | ICD-10-CM | POA: Diagnosis not present

## 2023-10-30 DIAGNOSIS — E221 Hyperprolactinemia: Secondary | ICD-10-CM | POA: Diagnosis not present

## 2023-10-30 DIAGNOSIS — Z23 Encounter for immunization: Secondary | ICD-10-CM | POA: Diagnosis not present

## 2023-11-21 DIAGNOSIS — K080 Exfoliation of teeth due to systemic causes: Secondary | ICD-10-CM | POA: Diagnosis not present

## 2023-11-28 ENCOUNTER — Other Ambulatory Visit: Payer: Self-pay

## 2023-12-11 DIAGNOSIS — K080 Exfoliation of teeth due to systemic causes: Secondary | ICD-10-CM | POA: Diagnosis not present

## 2023-12-21 DIAGNOSIS — D352 Benign neoplasm of pituitary gland: Secondary | ICD-10-CM | POA: Diagnosis not present

## 2023-12-25 DIAGNOSIS — E236 Other disorders of pituitary gland: Secondary | ICD-10-CM | POA: Diagnosis not present

## 2023-12-25 DIAGNOSIS — D352 Benign neoplasm of pituitary gland: Secondary | ICD-10-CM | POA: Diagnosis not present

## 2024-01-01 ENCOUNTER — Other Ambulatory Visit: Payer: Self-pay

## 2024-01-02 ENCOUNTER — Other Ambulatory Visit: Payer: Self-pay

## 2024-02-06 ENCOUNTER — Other Ambulatory Visit: Payer: Self-pay

## 2024-02-06 MED ORDER — CABERGOLINE 0.5 MG PO TABS
0.2500 mg | ORAL_TABLET | ORAL | 2 refills | Status: AC
  Filled 2024-02-06: qty 8, 56d supply, fill #0
  Filled 2024-03-28: qty 8, 56d supply, fill #1

## 2024-02-08 ENCOUNTER — Other Ambulatory Visit: Payer: Self-pay

## 2024-05-01 ENCOUNTER — Other Ambulatory Visit: Payer: Self-pay

## 2024-05-01 MED ORDER — TADALAFIL 10 MG PO TABS
10.0000 mg | ORAL_TABLET | ORAL | 0 refills | Status: AC | PRN
  Filled 2024-05-01: qty 15, 30d supply, fill #0

## 2024-05-01 MED ORDER — CABERGOLINE 0.5 MG PO TABS
0.2500 mg | ORAL_TABLET | ORAL | 2 refills | Status: AC
  Filled 2024-05-01: qty 8, 90d supply, fill #0
  Filled 2024-05-06: qty 8, 112d supply, fill #0
  Filled 2024-06-11: qty 8, 56d supply, fill #0
  Filled 2024-06-11: qty 8, 112d supply, fill #0

## 2024-05-03 ENCOUNTER — Other Ambulatory Visit: Payer: Self-pay

## 2024-05-06 ENCOUNTER — Other Ambulatory Visit: Payer: Self-pay

## 2024-06-04 ENCOUNTER — Other Ambulatory Visit: Payer: Self-pay

## 2024-06-05 ENCOUNTER — Other Ambulatory Visit: Payer: Self-pay

## 2024-06-06 ENCOUNTER — Other Ambulatory Visit: Payer: Self-pay

## 2024-06-06 MED ORDER — LOSARTAN POTASSIUM-HCTZ 100-25 MG PO TABS
1.0000 | ORAL_TABLET | Freq: Every day | ORAL | 2 refills | Status: AC
  Filled 2024-06-06: qty 90, 90d supply, fill #0
  Filled 2024-10-05: qty 90, 90d supply, fill #1

## 2024-06-11 ENCOUNTER — Other Ambulatory Visit: Payer: Self-pay

## 2024-06-27 ENCOUNTER — Other Ambulatory Visit (HOSPITAL_COMMUNITY): Payer: Self-pay

## 2024-10-16 ENCOUNTER — Other Ambulatory Visit: Payer: Self-pay

## 2024-10-16 MED ORDER — CABERGOLINE 0.5 MG PO TABS
0.5000 mg | ORAL_TABLET | ORAL | 2 refills | Status: AC
  Filled 2024-10-16: qty 8, 56d supply, fill #0

## 2024-10-22 ENCOUNTER — Other Ambulatory Visit: Payer: Self-pay

## 2024-10-22 MED ORDER — TESTOSTERONE 1.62 % TD GEL
40.5000 mg | Freq: Every day | TRANSDERMAL | 2 refills | Status: AC
  Filled 2024-10-22: qty 75, 90d supply, fill #0
